# Patient Record
Sex: Female | Born: 1985 | Race: Black or African American | Hispanic: No | Marital: Single | Smoking: Never smoker
Health system: Southern US, Community
[De-identification: ages and names within clinical notes are randomized; demographics above are authoritative.]

## PROBLEM LIST (undated history)

## (undated) ENCOUNTER — Inpatient Hospital Stay (HOSPITAL_COMMUNITY): Payer: Self-pay

## (undated) DIAGNOSIS — O009 Unspecified ectopic pregnancy without intrauterine pregnancy: Secondary | ICD-10-CM

## (undated) HISTORY — PX: DILATION AND CURETTAGE OF UTERUS: SHX78

## (undated) HISTORY — DX: Unspecified ectopic pregnancy without intrauterine pregnancy: O00.90

---
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
## 2006-01-14 ENCOUNTER — Encounter (INDEPENDENT_AMBULATORY_CARE_PROVIDER_SITE_OTHER): Payer: Self-pay | Admitting: *Deleted

## 2006-01-14 ENCOUNTER — Inpatient Hospital Stay (HOSPITAL_COMMUNITY): Admission: AD | Admit: 2006-01-14 | Discharge: 2006-01-15 | Payer: Self-pay | Admitting: Gynecology

## 2006-02-24 ENCOUNTER — Other Ambulatory Visit: Admission: RE | Admit: 2006-02-24 | Discharge: 2006-02-24 | Payer: Self-pay | Admitting: Obstetrics and Gynecology

## 2006-06-23 ENCOUNTER — Other Ambulatory Visit: Admission: RE | Admit: 2006-06-23 | Discharge: 2006-06-23 | Payer: Self-pay | Admitting: Obstetrics and Gynecology

## 2006-12-23 ENCOUNTER — Other Ambulatory Visit: Admission: RE | Admit: 2006-12-23 | Discharge: 2006-12-23 | Payer: Self-pay | Admitting: Obstetrics and Gynecology

## 2007-08-23 ENCOUNTER — Other Ambulatory Visit: Admission: RE | Admit: 2007-08-23 | Discharge: 2007-08-23 | Payer: Self-pay | Admitting: Obstetrics and Gynecology

## 2008-06-14 ENCOUNTER — Ambulatory Visit: Payer: Self-pay | Admitting: Obstetrics and Gynecology

## 2008-06-14 ENCOUNTER — Other Ambulatory Visit: Admission: RE | Admit: 2008-06-14 | Discharge: 2008-06-14 | Payer: Self-pay | Admitting: Obstetrics and Gynecology

## 2008-06-14 ENCOUNTER — Encounter: Payer: Self-pay | Admitting: Obstetrics and Gynecology

## 2008-08-15 ENCOUNTER — Ambulatory Visit: Payer: Self-pay | Admitting: Obstetrics and Gynecology

## 2008-08-16 ENCOUNTER — Ambulatory Visit: Payer: Self-pay | Admitting: Obstetrics and Gynecology

## 2008-10-06 ENCOUNTER — Inpatient Hospital Stay (HOSPITAL_COMMUNITY): Admission: AD | Admit: 2008-10-06 | Discharge: 2008-10-06 | Payer: Self-pay | Admitting: Obstetrics and Gynecology

## 2009-02-01 ENCOUNTER — Ambulatory Visit: Payer: Self-pay | Admitting: Obstetrics and Gynecology

## 2010-01-02 ENCOUNTER — Emergency Department (HOSPITAL_COMMUNITY): Admission: EM | Admit: 2010-01-02 | Discharge: 2010-01-02 | Payer: Self-pay | Admitting: Emergency Medicine

## 2010-01-10 ENCOUNTER — Emergency Department (HOSPITAL_COMMUNITY): Admission: EM | Admit: 2010-01-10 | Discharge: 2010-01-10 | Payer: Self-pay | Admitting: Emergency Medicine

## 2010-05-14 ENCOUNTER — Ambulatory Visit: Payer: Self-pay | Admitting: Obstetrics & Gynecology

## 2010-05-14 ENCOUNTER — Inpatient Hospital Stay (HOSPITAL_COMMUNITY): Admission: AD | Admit: 2010-05-14 | Discharge: 2010-05-15 | Payer: Self-pay | Admitting: Obstetrics and Gynecology

## 2010-12-27 LAB — WET PREP, GENITAL
Clue Cells Wet Prep HPF POC: NONE SEEN
Trich, Wet Prep: NONE SEEN

## 2010-12-27 LAB — URINALYSIS, ROUTINE W REFLEX MICROSCOPIC
Bilirubin Urine: NEGATIVE
Glucose, UA: NEGATIVE mg/dL
Hgb urine dipstick: NEGATIVE
Ketones, ur: NEGATIVE mg/dL
Nitrite: NEGATIVE
Specific Gravity, Urine: >1.03 — ABNORMAL HIGH (ref 1.005–1.030)
pH: 5.5 (ref 5.0–8.0)

## 2010-12-27 LAB — CBC
MCH: 29.9 pg (ref 26.0–34.0)
RBC: 4.03 MIL/uL (ref 3.87–5.11)
WBC: 7.8 10*3/uL (ref 4.0–10.5)

## 2010-12-27 LAB — GC/CHLAMYDIA PROBE AMP, GENITAL
Chlamydia, DNA Probe: NEGATIVE
GC Probe Amp, Genital: NEGATIVE

## 2010-12-27 LAB — ABO/RH: ABO/RH(D): B POS

## 2010-12-27 LAB — URINE MICROSCOPIC-ADD ON: RBC / HPF: NONE SEEN RBC/hpf (ref <3)

## 2011-02-28 NOTE — Op Note (Signed)
NAMEDWANDA, TUFANO          ACCOUNT NO.:  [id number]   MEDICAL RECORD NO.:  [id number]          PATIENT TYPE:  INP   LOCATION:  9173                          FACILITY:  [facility]   PHYSICIAN:  Ivor Costa. Farrel Gobble, M.D. DATE OF BIRTH:  1986-04-16   DATE OF PROCEDURE:  01/14/2006  DATE OF DISCHARGE:                                 OPERATIVE REPORT   PREOPERATIVE DIAGNOSIS:  Retained placenta after 19 weeks spontaneous  abortion.   POSTOPERATIVE DIAGNOSIS:  Retained placenta after 19 weeks spontaneous  abortion.  Postpartum hemorrhage secondary to uterine atony.   OPERATION PERFORMED:  Suction and sharp curettage.   SURGEON:  Ivor Costa. Farrel Gobble, M.D.   ANESTHESIA:  General.   ESTIMATED BLOOD LOSS:  Approximately 1000 mL.   FINDINGS:  The cervix is grossly dilated 2 to 3 cm.  Once obtained, the  placenta was for the most part intact with a fragmented side.  The membranes  were glistening and clear.  The uterus had normal cry and smooth contours.   COMPLICATIONS:  None.   PATHOLOGY:  Placenta to pathology.  Aerobic and anaerobic cultures and a  portion of the placenta for chromosome analysis.   DESCRIPTION OF PROCEDURE:  The patient was taken to the operating room after  it turned out to be over three hours post delivery of a spontaneous abortion  with no change to her cervix.  She was given general anesthesia and placed  in the dorsal lithotomy position, prepped and draped in the usual sterile  fashion.  A sterile weighted speculum was placed in the vagina. The cervix  was visualized and stabilized with a single toothed tenaculum.  It was  grossly dilated and the Kelly clamp placed at the time of delivery was  hanging from the thin umbilical cord.  A curette was then advanced to the  cervix.  However, no placental tissue was able to be obtained and bimanual  exam could not palpate deep enough into the uterus to identify the actual  placement of the placenta.  A suction curette was  then advanced through the  cervix.  Unfortunately, the cervix was dilated beyond the largest suction  curette that was available.  The suction curette was used to clear the  uterus of blood and help disrupt the placenta.  While waiting for a larger  suction curette, a silver clamp was then advanced through the cervix and the  placental edge was able to be grasped. The placenta was removed for the most  part what appeared to be entirely. The suction curette was then advanced  back to the cavity. A small amount of what appeared to be decidua was  obtained; however, the patient continued to bleed.  A gentle curettage  showed a uterine cry on all four walls.  The cervix was clamped around the  suction curette to obtain a better suction; however, the patient still  continued to bleed from the uterine cavity.  Once the placenta was felt to  be removed entirely, the patient did receive methergine IM and uterine  massage. Despite uterine massage, the patient did not respond and her uterus  continued to fill with blood and remain atonic.  The patient then received  Carboprost intramuscularly while we were waiting for Cytotec to come from  the pharmacy.  Once that arrived, the patient received 800 of Cytotec per  rectum.  We continued vigorous uterine massage; however, between massages,  she continued to become atonic and again suction was advanced to help remove  some of the blood.  We never saw anything that looked like tissue once the  methergine had been given the first time.  Uterine massage was continued and  ultimately after 20 minutes, the second Methergine was able to be given.  We  continued to massage vigorously at which point, the uterus did clamp down.  We watched her perineum and vagina for several minutes and the vagina failed  to fill with blood.  The patient was never placed in Trendelenburg position  to falsify any blood accumulation in the uterus. The patient was then  extubated in  the operating room and transferred back to the recovery room  and then labor and delivery for continued observation.  The patient  tolerated the procedure well and her vitals remained stable throughout.      Ivor Costa. Farrel Gobble, M.D.  Electronically Signed     THL/MEDQ  D:  01/14/2006  T:  01/15/2006  Job:  [id number]

## 2011-02-28 NOTE — Discharge Summary (Signed)
Michelle Hobbs, Michelle Hobbs          ACCOUNT NO.:  [id number]   MEDICAL RECORD NO.:  [id number]          PATIENT TYPE:  INP   LOCATION:  9317                          [facility]ILITY:  [facility]   PHYSICIAN:  Ivor Costa. Farrel Gobble, M.D. DATE OF BIRTH:  [birth year]   DATE OF ADMISSION:  01/14/2006  DATE OF DISCHARGE:  01/15/2006                                 DISCHARGE SUMMARY   PRINCIPAL DIAGNOSIS:  Spontaneous AB at 19 weeks.   ADDITIONAL DIAGNOSES:  Retained placenta and postpartum hemorrhage.   PRINCIPAL PROCEDURE:  Delivery of 19-week nonviable infant.   ADDITIONAL PROCEDURES:  D&C and transfusion.   HOSPITAL COURSE:  The patient presented early in the morning of January 14, 2006, with complaints of a prolapsed fetal leg through the vagina.  She was  brought in by EMS.  The infant was noted to be delivered to the mid torso at  that point.  The patient was given IV sedation in order to aid in delivery  of the fetal head, as she was rather uncomfortable.  Upon delivery, there  was no fetal heart rate, and the placenta remained intact.  The patient was  given IV Pitocin and despite 2-1/2 hours from delivery did not spontaneously  deliver the placenta.  She had had no bleeding since the initial delivery of  the fetus and had no further cramping.  The patient was therefore  transferred to the OR at what was over 3 hours postdelivery.  She was given  general anesthesia, and still had no delivery of the placenta.  She  underwent a sharp curettage with removal of the placenta which appeared to  be grossly intact and a suction curettage as well.  Despite what felt like a  good uterine cry throughout, the patient's uterus became boggy, and she  continued to bleed briskly.  The patient was ultimately treated with  Methergine x2, Hemabate, and 800 of Cytotec per rectum, after which point  the patient stopped bleeding but because of the amount of bleeding, a CBC  was performed 4 hours postdelivery.  The  estimated blood loss was  approximately 1500.  Her preop hemoglobin was 10.6.  Her postop hemoglobin  was 7.7 and initially, the patient was planning on going home.  Her vitals  were stable.  However, prior to discharge, the patient began becoming  tachycardiac.  Of note, her IV site had been locked.  A repeat CBC was drawn  in the morning of April 5, which showed her hemoglobin which was now felt to  be more equilibrated at 5.7.  The patient was symptomatic and was therefore  transfused 2 units of packed red cells per Dr. Lily Peer.  By this evening,  the patient was feeling well.  She was not short of breath.  She was  ambulating without difficulty, and she was no longer tachycardic.  Her  hemoglobin post 4 hours posttransfusion was 8; her hematocrit was 23.9 with  platelets of 186.  Her white count was 6.1.  Her vitals at discharge:  Her  blood pressure was 112/59.  Her heart rate was 90.  Her respiratory rate was  16.  She remained afebrile throughout her hospital course.  The patient was  discharged home in stable condition.  She will follow up in the office 2  weeks postop.  She will continue to take her prenatal vitamins with extra  iron, and we will review her labs at that point.  No pain medication was  given.  All questions were addressed.      Ivor Costa. Farrel Gobble, M.D.  Electronically Signed    THL/MEDQ  D:  01/15/2006  T:  01/16/2006  Job:  [id number]

## 2011-07-17 LAB — WET PREP, GENITAL
Trich, Wet Prep: NONE SEEN
Yeast Wet Prep HPF POC: NONE SEEN

## 2011-07-17 LAB — URINALYSIS, ROUTINE W REFLEX MICROSCOPIC
Bilirubin Urine: NEGATIVE
Protein, ur: NEGATIVE mg/dL
Specific Gravity, Urine: >1.03 — ABNORMAL HIGH (ref 1.005–1.030)
pH: 5.5 (ref 5.0–8.0)

## 2011-07-17 LAB — CBC
Hemoglobin: 11.3 g/dL — ABNORMAL LOW (ref 12.0–15.0)
MCHC: 33.3 g/dL (ref 30.0–36.0)
MCV: 88.8 fL (ref 78.0–100.0)

## 2011-12-02 ENCOUNTER — Encounter: Payer: Self-pay | Admitting: Gynecology

## 2011-12-02 ENCOUNTER — Ambulatory Visit (INDEPENDENT_AMBULATORY_CARE_PROVIDER_SITE_OTHER): Payer: Commercial Managed Care - PPO | Admitting: Gynecology

## 2011-12-02 ENCOUNTER — Other Ambulatory Visit (HOSPITAL_COMMUNITY)
Admission: RE | Admit: 2011-12-02 | Discharge: 2011-12-02 | Disposition: A | Discharge status: Home / Self Care | Payer: Commercial Managed Care - PPO | Source: Ambulatory Visit | Attending: Gynecology | Admitting: Gynecology

## 2011-12-02 DIAGNOSIS — N760 Acute vaginitis: Secondary | ICD-10-CM

## 2011-12-02 DIAGNOSIS — Z01419 Encounter for gynecological examination (general) (routine) without abnormal findings: Secondary | ICD-10-CM

## 2011-12-02 DIAGNOSIS — A499 Bacterial infection, unspecified: Secondary | ICD-10-CM

## 2011-12-02 DIAGNOSIS — IMO0001 Reserved for inherently not codable concepts without codable children: Secondary | ICD-10-CM

## 2011-12-02 DIAGNOSIS — Z113 Encounter for screening for infections with a predominantly sexual mode of transmission: Secondary | ICD-10-CM

## 2011-12-02 DIAGNOSIS — Z309 Encounter for contraceptive management, unspecified: Secondary | ICD-10-CM

## 2011-12-02 DIAGNOSIS — B9689 Other specified bacterial agents as the cause of diseases classified elsewhere: Secondary | ICD-10-CM

## 2011-12-02 DIAGNOSIS — R635 Abnormal weight gain: Secondary | ICD-10-CM

## 2011-12-02 LAB — WET PREP FOR TRICH, YEAST, CLUE
Trich, Wet Prep: NONE SEEN
WBC, Wet Prep HPF POC: NONE SEEN

## 2011-12-02 LAB — URINALYSIS W MICROSCOPIC + REFLEX CULTURE
Bilirubin Urine: NEGATIVE
Glucose, UA: NEGATIVE mg/dL
Ketones, ur: NEGATIVE mg/dL
Specific Gravity, Urine: 1.02 (ref 1.005–1.030)
Urobilinogen, UA: 0.2 mg/dL (ref 0.0–1.0)

## 2011-12-02 MED ORDER — METRONIDAZOLE 500 MG PO TABS: 500.0000 mg | ORAL_TABLET | Freq: Two times a day (BID) | ORAL | Status: DC

## 2011-12-02 NOTE — Progress Notes (Signed)
Michelle Hobbs 03-29-1986 [id number]   History:    26 y.o. gravida 4 para 0 AB 4 (4 elective AB). Patient received only one had a 3 Gardasil vaccination. Patient refused the remaining vaccinations. She's been using condoms for contraception. Cycles are reported to be normal otherwise. Patient requesting an STD screen. Patient with family history of diabetes. Patient complaining of weight gain as well.  Past medical history,surgical history, family history and social history were all reviewed and documented in the EPIC chart.  Gynecologic History Patient's last menstrual period was 11/23/2011. Contraception: condoms Last Pap: 2009. Results were: normal Last mammogram: Not indicated. Results were: Not indicated  Obstetric History OB History    Grav Para Term Preterm Abortions TAB SAB Ect Mult Living   4 0   4     0     # Outc Date GA Lbr Len/2nd Wgt Sex Del Anes PTL Lv   1 ABT            2 ABT            3 ABT            4 ABT                ROS:  Was performed and pertinent positives and negatives are included in the history.  Exam: chaperone present  BP 128/82  Ht 5' 6.5" (1.689 m)  Wt 241 lb (109.317 kg)  BMI 38.32 kg/m2  LMP 11/23/2011  Body mass index is 38.32 kg/(m^2).  General appearance : Well developed well nourished female. No acute distress HEENT: Neck supple, trachea midline, no carotid bruits, no thyroidmegaly Lungs: Clear to auscultation, no rhonchi or wheezes, or rib retractions  Heart: Regular rate and rhythm, no murmurs or gallops Breast:Examined in sitting and supine position were symmetrical in appearance, no palpable masses or tenderness,  no skin retraction, no nipple inversion, no nipple discharge, no skin discoloration, no axillary or supraclavicular lymphadenopathy Abdomen: no palpable masses or tenderness, no rebound or guarding Extremities: no edema or skin discoloration or tenderness  Pelvic:  Bartholin, Urethra, Skene Glands: Within normal  limits             Vagina: No gross lesions or discharge  Cervix: No gross lesions or discharge  Uterus  anteverted, normal size, shape and consistency, non-tender and mobile  Adnexa  Without masses or tenderness  Anus and perineum  normal   Rectovaginal  normal sphincter tone without palpated masses or tenderness             Hemoccult not done     Assessment/Plan:  26 y.o. gravida 4 para 0 AB 4 (elective terminations). We discussed alternative form of contraception. She states that hormone-containing products cause her to gain weight and she cannot tolerate well. We discussed the nonhormonal IUD such as the ParaGard T380A. Literature information was provided and she will schedule accordingly. We did do a wet prep GC and Chlamydia culture. The wet prep demonstrated evidence of bacterial vaginosis for which she will be treated with Flagyl 500 mg one by mouth twice a day for 5 days. The results of the GC and Chlamydia culture pending at time of this dictation. She is to stop by the lab to check a CBC, hemoglobin A1c, TSH, total cholesterol, RPR, HIV, and hepatitis B and C. Will notify her when the results become available if there is any abnormality. Pap smear done today. She was encouraged to do monthly self breast examination. Literature  information was provided on breast examination as well as on diet and exercise.    Ok Edwards MD, 11:30 AM 12/02/2011

## 2011-12-02 NOTE — Patient Instructions (Addendum)
Exercise to Lose Weight Exercise and a healthy diet may help you lose weight. Your doctor may suggest specific exercises. EXERCISE IDEAS AND TIPS  Choose low-cost things you enjoy doing, such as walking, bicycling, or exercising to workout videos.   Take stairs instead of the elevator.   Walk during your lunch break.   Park your car further away from work or school.   Go to a gym or an exercise class.   Start with 5 to 10 minutes of exercise each day. Build up to 30 minutes of exercise 4 to 6 days a week.   Wear shoes with good support and comfortable clothes.   Stretch before and after working out.   Work out until you breathe harder and your heart beats faster.   Drink extra water when you exercise.   Do not do so much that you hurt yourself, feel dizzy, or get very short of breath.  Exercises that burn about 150 calories:  Running 1  miles in 15 minutes.   Playing volleyball for 45 to 60 minutes.   Washing and waxing a car for 45 to 60 minutes.   Playing touch football for 45 minutes.   Walking 1  miles in 35 minutes.   Pushing a stroller 1  miles in 30 minutes.   Playing basketball for 30 minutes.   Raking leaves for 30 minutes.   Bicycling 5 miles in 30 minutes.   Walking 2 miles in 30 minutes.   Dancing for 30 minutes.   Shoveling snow for 15 minutes.   Swimming laps for 20 minutes.   Walking up stairs for 15 minutes.   Bicycling 4 miles in 15 minutes.   Gardening for 30 to 45 minutes.   Jumping rope for 15 minutes.   Washing windows or floors for 45 to 60 minutes.  Document Released: 11/01/2010 Document Revised: 06/11/2011 Document Reviewed: 11/01/2010 [facility] Patient Information 2012 [place], [place].   Breast Self-Exam A self breast exam may help you find changes or problems while they are still small. Do a breast self-exam:  Every month.   One week after your period (menstrual period).   On the first day of each month if you do not  have periods anymore.  Look for any:  Change in breast color, size, or shape.   Dimples in your breast.   Changes in your nipples or skin.   Dry skin on your breasts or nipples.   Watery or bloody discharge from your nipples.   Feel for:  Lumps.   Thick, hard places.   Any other changes.  HOME CARE There are 3 ways to do the breast self-exam: In front of a mirror.  Lift your arms over your head and turn side to side.   Put your hands on your hips and lean down, then turn from side to side.   Bend forward and turn from side to side.  In the shower.  With soapy hands, check both breasts. Then check above and below your collarbone and your armpits.   Feel above and below your collarbone down to under your breast, and from the center of your chest to the outer edge of the armpit. Check for any lumps or hard spots.   Using the tips of your middle three fingers check your whole breast by pressing your hand over your breast in a circle or in an up and down motion.  Lying down.  Lie flat on your bed.   Put a small  pillow under the breast you are going to check. On that same side, put your hand behind your head.   With your other hand, use the 3 middle fingers to feel the breast.   Move your fingers in a circle around the breast. Press firmly over all parts of the breast to feel for any lumps.  GET HELP RIGHT AWAY IF: You find any changes in your breasts so they can be checked. Document Released: 03/17/2008 Document Revised: 06/11/2011 Document Reviewed: 01/17/2009 [facility] Patient Information 2012 [place], [place].  Bacterial Vaginosis Bacterial vaginosis (BV) is a vaginal infection where the normal balance of bacteria in the vagina is disrupted. The normal balance is then replaced by an overgrowth of certain bacteria. There are several different kinds of bacteria that can cause BV. BV is the most common vaginal infection in women of childbearing age. CAUSES   The cause of  BV is not fully understood. BV develops when there is an increase or imbalance of harmful bacteria.   Some activities or behaviors can upset the normal balance of bacteria in the vagina and put women at increased risk including:   Having a new sex partner or multiple sex partners.   Douching.   Using an intrauterine device (IUD) for contraception.   It is not clear what role sexual activity plays in the development of BV. However, women that have never had sexual intercourse are rarely infected with BV.  Women do not get BV from toilet seats, bedding, swimming pools or from touching objects around them.  SYMPTOMS   Grey vaginal discharge.   A fish-like odor with discharge, especially after sexual intercourse.   Itching or burning of the vagina and vulva.   Burning or pain with urination.   Some women have no signs or symptoms at all.  DIAGNOSIS  Your caregiver must examine the vagina for signs of BV. Your caregiver will perform lab tests and look at the sample of vaginal fluid through a microscope. They will look for bacteria and abnormal cells (clue cells), a pH test higher than 4.5, and a positive amine test all associated with BV.  RISKS AND COMPLICATIONS   Pelvic inflammatory disease (PID).   Infections following gynecology surgery.   Developing HIV.   Developing herpes virus.  TREATMENT  Sometimes BV will clear up without treatment. However, all women with symptoms of BV should be treated to avoid complications, especially if gynecology surgery is planned. Female partners generally do not need to be treated. However, BV may spread between female sex partners so treatment is helpful in preventing a recurrence of BV.   BV may be treated with antibiotics. The antibiotics come in either pill or vaginal cream forms. Either can be used with nonpregnant or pregnant women, but the recommended dosages differ. These antibiotics are not harmful to the baby.   BV can recur after  treatment. If this happens, a second round of antibiotics will often be prescribed.   Treatment is important for pregnant women. If not treated, BV can cause a premature delivery, especially for a pregnant woman who had a premature birth in the past. All pregnant women who have symptoms of BV should be checked and treated.   For chronic reoccurrence of BV, treatment with a type of prescribed gel vaginally twice a week is helpful.  HOME CARE INSTRUCTIONS   Finish all medication as directed by your caregiver.   Do not have sex until treatment is completed.   Tell your sexual partner that  you have a vaginal infection. They should see their caregiver and be treated if they have problems, such as a mild rash or itching.   Practice safe sex. Use condoms. Only have 1 sex partner.  PREVENTION  Basic prevention steps can help reduce the risk of upsetting the natural balance of bacteria in the vagina and developing BV:  Do not have sexual intercourse (be abstinent).   Do not douche.   Use all of the medicine prescribed for treatment of BV, even if the signs and symptoms go away.   Tell your sex partner if you have BV. That way, they can be treated, if needed, to prevent reoccurrence.  SEEK MEDICAL CARE IF:   Your symptoms are not improving after 3 days of treatment.   You have increased discharge, pain, or fever.  MAKE SURE YOU:   Understand these instructions.   Will watch your condition.   Will get help right away if you are not doing well or get worse.  FOR MORE INFORMATION  Division of STD Prevention (DSTDP), Centers for Disease Control and Prevention: [employer] American Social Health Association (ASHA): www.ashastd.org  Document Released: 09/29/2005 Document Revised: 06/11/2011 Document Reviewed: 03/22/2009 [facility] Patient Information 2012 [place], [place].

## 2011-12-03 LAB — CBC WITH DIFFERENTIAL/PLATELET
Basophils Absolute: 0 10*3/uL (ref 0.0–0.1)
Eosinophils Relative: 3 % (ref 0–5)
HCT: 42.6 % (ref 36.0–46.0)
Lymphocytes Relative: 29 % (ref 12–46)
MCV: 86.1 fL (ref 78.0–100.0)
Neutro Abs: 3.3 10*3/uL (ref 1.7–7.7)
Platelets: 225 10*3/uL (ref 150–400)
RDW: 12.9 % (ref 11.5–15.5)

## 2011-12-03 LAB — HEPATITIS C ANTIBODY: HCV Ab: NEGATIVE

## 2011-12-03 LAB — HEMOGLOBIN A1C: Hgb A1c MFr Bld: 5.1 % (ref <5.7)

## 2011-12-03 LAB — HEPATITIS B SURFACE ANTIGEN: Hepatitis B Surface Ag: NEGATIVE

## 2011-12-03 LAB — GC/CHLAMYDIA PROBE AMP, GENITAL
Chlamydia, DNA Probe: NEGATIVE
GC Probe Amp, Genital: NEGATIVE

## 2011-12-03 LAB — HIV ANTIBODY (ROUTINE TESTING W REFLEX): HIV: NONREACTIVE

## 2011-12-03 LAB — RPR

## 2011-12-04 ENCOUNTER — Telehealth: Payer: Self-pay | Admitting: *Deleted

## 2011-12-04 DIAGNOSIS — B9689 Other specified bacterial agents as the cause of diseases classified elsewhere: Secondary | ICD-10-CM

## 2011-12-04 DIAGNOSIS — N760 Acute vaginitis: Secondary | ICD-10-CM

## 2011-12-04 MED ORDER — METRONIDAZOLE 500 MG PO TABS: 500.0000 mg | ORAL_TABLET | Freq: Two times a day (BID) | ORAL | Status: AC

## 2011-12-04 NOTE — Telephone Encounter (Signed)
Pt called stating she left her rx at her mother house in [place]. Pt requested to have flagyl resent to pharmacy, rx sent to pharmacy. I explained to pt that her insurance may not help pay for medication due to 2 request.

## 2011-12-05 ENCOUNTER — Other Ambulatory Visit: Payer: Self-pay | Admitting: *Deleted

## 2011-12-05 DIAGNOSIS — R319 Hematuria, unspecified: Secondary | ICD-10-CM

## 2011-12-09 ENCOUNTER — Other Ambulatory Visit: Payer: Self-pay | Admitting: Gynecology

## 2011-12-09 ENCOUNTER — Other Ambulatory Visit: Payer: Commercial Managed Care - PPO

## 2011-12-09 ENCOUNTER — Telehealth: Payer: Self-pay | Admitting: *Deleted

## 2011-12-09 MED ORDER — FLUCONAZOLE 150 MG PO TABS: 150.0000 mg | ORAL_TABLET | Freq: Once | ORAL | Status: AC

## 2011-12-09 NOTE — Telephone Encounter (Signed)
Pt was seen on 12/02/11 for aex and giving flagyl 500 mg. Pt now has yeast infection, itching ,white discharge. Pt would like medication to help relieve. Please advise

## 2011-12-09 NOTE — Telephone Encounter (Signed)
Order for prescription for Diflucan placed. Please call patient to go the pharmacy to pick up.

## 2011-12-09 NOTE — Telephone Encounter (Signed)
Left message on pt vm that rx sent to pharmacy.

## 2011-12-12 DIAGNOSIS — O009 Unspecified ectopic pregnancy without intrauterine pregnancy: Secondary | ICD-10-CM

## 2011-12-12 HISTORY — PX: PELVIC LAPAROSCOPY: SHX162

## 2011-12-12 HISTORY — DX: Unspecified ectopic pregnancy without intrauterine pregnancy: O00.90

## 2012-06-16 ENCOUNTER — Ambulatory Visit (INDEPENDENT_AMBULATORY_CARE_PROVIDER_SITE_OTHER): Payer: Commercial Managed Care - PPO

## 2012-06-16 ENCOUNTER — Ambulatory Visit (INDEPENDENT_AMBULATORY_CARE_PROVIDER_SITE_OTHER): Payer: Commercial Managed Care - PPO | Admitting: Gynecology

## 2012-06-16 ENCOUNTER — Encounter: Payer: Self-pay | Admitting: Gynecology

## 2012-06-16 VITALS — BP 124/80

## 2012-06-16 DIAGNOSIS — Z8742 Personal history of other diseases of the female genital tract: Secondary | ICD-10-CM

## 2012-06-16 DIAGNOSIS — N912 Amenorrhea, unspecified: Secondary | ICD-10-CM

## 2012-06-16 DIAGNOSIS — N83209 Unspecified ovarian cyst, unspecified side: Secondary | ICD-10-CM

## 2012-06-16 DIAGNOSIS — O2690 Pregnancy related conditions, unspecified, unspecified trimester: Secondary | ICD-10-CM

## 2012-06-16 DIAGNOSIS — N831 Corpus luteum cyst of ovary, unspecified side: Secondary | ICD-10-CM

## 2012-06-16 DIAGNOSIS — Z8759 Personal history of other complications of pregnancy, childbirth and the puerperium: Secondary | ICD-10-CM

## 2012-06-16 LAB — CBC WITH DIFFERENTIAL/PLATELET
Basophils Relative: 0 % (ref 0–1)
Eosinophils Absolute: 0.1 10*3/uL (ref 0.0–0.7)
Eosinophils Relative: 1 % (ref 0–5)
Hemoglobin: 12.1 g/dL (ref 12.0–15.0)
MCH: 28 pg (ref 26.0–34.0)
MCHC: 32.8 g/dL (ref 30.0–36.0)
MCV: 85.4 fL (ref 78.0–100.0)
Monocytes Absolute: 0.4 10*3/uL (ref 0.1–1.0)
Monocytes Relative: 7 % (ref 3–12)
Neutrophils Relative %: 67 % (ref 43–77)

## 2012-06-16 LAB — US OB TRANSVAGINAL

## 2012-06-16 LAB — HCG, QUANTITATIVE, PREGNANCY: hCG, Beta Chain, Quant, S: 112208 m[IU]/mL

## 2012-06-16 NOTE — Progress Notes (Signed)
Patient is a 26 year old now gravida 5 para 0 (1 ectopic pregnancy in March whereby she states her her left fallopian tube was removed at  [facility] in [facility] She not been using any form of contraception recently. She had a positive home pregnancy test. She denied any vaginal bleeding or abdominal pain. She has had 2 prior elective abortions and one spontaneous AB. She denied any nausea or vomiting. She denies any prior history of STDs. Urine pregnancy test confirmed positive the office today.  Exam: Abdomen soft nontender no rebound or guarding pelvic: Bartholin urethra Skene was within normal limits Vagina: No lesions or discharge Cervix: No lesions or discharge Uterus 6-8 weeks size patient slightly overweight adnexa difficult to assess but there was no rebound or guarding during bimanual examination. Rectal exam not done  CBC today: Hemoglobin 12.1 hematocrit 36.9 platelet count 213,000  Quantitative beta-hCG today: 112,208 milli-international units per mL  Transvaginal ultrasound today: Viable intrauterine pregnancy size greater than dates was noted by last menstrual period by 7 days. Fetal pole and cardiac activity was seen. Cervix long closed. Normal yolk sac. Right ovary normal. Left ovary echo-free cyst measuring 29 x 23 mm and a corpus luteum cyst 27 x 24 mm was noted when negative fluid in the cul-de-sac.  Assessment/plan: Viable intrauterine pregnancy size greater than dates by one week. Estimated gestational age [redacted] weeks by last menstrual period with due date 02/02/2013. By ultrasound she is 8 weeks with an estimated date of confinement 01/26/2013. Patient was complaining of nausea and occasional vomiting she'll be given a prescription of Reglan 10 mg to take one by mouth every 4-6 hours when necessary. She was encouraged to have multiple small feedings throughout the day. She will be prescribed prenatal vitamins as well. She'll be referred to our [facility]  colleagues for her obstetrical care. Were provided patient with a copy of the ultrasound for her to carry with her to her first OB visit in 2 weeks.
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
## 2012-08-10 LAB — OB RESULTS CONSOLE GBS: GBS: POSITIVE

## 2012-08-25 ENCOUNTER — Other Ambulatory Visit: Payer: Self-pay | Admitting: Gynecology

## 2012-10-13 NOTE — L&D Delivery Note (Signed)
Delivery Note At 5:15 PM a viable and healthy female was delivered via Vaginal, Spontaneous Delivery (Presentation: Right Occiput Anterior).  APGAR: 7, 9; weight .   Placenta status: Intact, Spontaneous. Short not sent  Cord: 3 vessels with the following complications: Short.  Cord pH: none  Anesthesia: Epidural  Episiotomy: None Lacerations: Labial;Vaginal;Sulcus Suture Repair: 3.0 chromic Est. Blood Loss (mL): 250  Mom to postpartum.  Baby to nursery-stable.  Michelle Hobbs A 01/28/2013, 5:50 PM

## 2012-10-20 LAB — OB RESULTS CONSOLE HIV ANTIBODY (ROUTINE TESTING): HIV: NONREACTIVE

## 2012-10-20 LAB — OB RESULTS CONSOLE ABO/RH

## 2012-10-30 LAB — OB RESULTS CONSOLE ABO/RH: RH Type: POSITIVE

## 2013-01-16 ENCOUNTER — Inpatient Hospital Stay (HOSPITAL_COMMUNITY)
Admission: AD | Admit: 2013-01-16 | Discharge: 2013-01-16 | Disposition: A | Discharge status: Home / Self Care | Payer: Commercial Managed Care - PPO | Source: Ambulatory Visit | Attending: Obstetrics and Gynecology | Admitting: Obstetrics and Gynecology

## 2013-01-16 ENCOUNTER — Other Ambulatory Visit: Payer: Self-pay

## 2013-01-16 ENCOUNTER — Encounter (HOSPITAL_COMMUNITY): Payer: Self-pay | Admitting: *Deleted

## 2013-01-16 DIAGNOSIS — R079 Chest pain, unspecified: Secondary | ICD-10-CM | POA: Diagnosis present

## 2013-01-16 DIAGNOSIS — R1013 Epigastric pain: Secondary | ICD-10-CM | POA: Insufficient documentation

## 2013-01-16 DIAGNOSIS — O99891 Other specified diseases and conditions complicating pregnancy: Secondary | ICD-10-CM | POA: Diagnosis not present

## 2013-01-16 MED ORDER — PANTOPRAZOLE SODIUM 40 MG IV SOLR: 40.0000 mg | Freq: Once | INTRAVENOUS | Status: DC

## 2013-01-16 MED ORDER — PANTOPRAZOLE SODIUM 40 MG PO TBEC
40.0000 mg | DELAYED_RELEASE_TABLET | Freq: Once | ORAL | Status: AC
  Administered 2013-01-16: 40 mg via ORAL
  Filled 2013-01-16: qty 1

## 2013-01-16 MED ORDER — GI COCKTAIL ~~LOC~~
30.0000 mL | Freq: Once | ORAL | Status: AC
  Administered 2013-01-16: 30 mL via ORAL
  Filled 2013-01-16: qty 30

## 2013-01-16 NOTE — MAU Note (Signed)
Pt G5 P0 at 38.4wks having chest pain for 6 weeks, the pain comes and goes, lasting about 3 hrs at a time.
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
## 2013-01-20 ENCOUNTER — Other Ambulatory Visit: Payer: Self-pay | Admitting: Gastroenterology

## 2013-01-20 DIAGNOSIS — R1013 Epigastric pain: Secondary | ICD-10-CM

## 2013-01-20 DIAGNOSIS — R0789 Other chest pain: Secondary | ICD-10-CM

## 2013-01-27 ENCOUNTER — Other Ambulatory Visit: Payer: Self-pay | Admitting: Obstetrics and Gynecology

## 2013-01-28 ENCOUNTER — Encounter (HOSPITAL_COMMUNITY): Payer: Self-pay

## 2013-01-28 ENCOUNTER — Inpatient Hospital Stay (HOSPITAL_COMMUNITY): Payer: Commercial Managed Care - PPO | Admitting: Anesthesiology

## 2013-01-28 ENCOUNTER — Other Ambulatory Visit: Payer: Self-pay | Admitting: Obstetrics and Gynecology

## 2013-01-28 ENCOUNTER — Inpatient Hospital Stay (HOSPITAL_COMMUNITY)
Admission: RE | Admit: 2013-01-28 | Discharge: 2013-01-30 | DRG: 775 | Disposition: A | Discharge status: Home / Self Care | Payer: Commercial Managed Care - PPO | Source: Ambulatory Visit | Attending: Obstetrics and Gynecology | Admitting: Obstetrics and Gynecology

## 2013-01-28 ENCOUNTER — Encounter (HOSPITAL_COMMUNITY): Payer: Self-pay | Admitting: Anesthesiology

## 2013-01-28 VITALS — BP 120/76 | HR 73 | Temp 98.5°F | Resp 18 | Ht 67.0 in | Wt 247.0 lb

## 2013-01-28 DIAGNOSIS — O480 Post-term pregnancy: Secondary | ICD-10-CM | POA: Diagnosis present

## 2013-01-28 DIAGNOSIS — O9903 Anemia complicating the puerperium: Secondary | ICD-10-CM | POA: Diagnosis not present

## 2013-01-28 DIAGNOSIS — O99892 Other specified diseases and conditions complicating childbirth: Secondary | ICD-10-CM | POA: Diagnosis present

## 2013-01-28 DIAGNOSIS — Z22330 Carrier of Group B streptococcus: Secondary | ICD-10-CM

## 2013-01-28 DIAGNOSIS — D62 Acute posthemorrhagic anemia: Secondary | ICD-10-CM | POA: Diagnosis not present

## 2013-01-28 LAB — CBC
Hemoglobin: 11.9 g/dL — ABNORMAL LOW (ref 12.0–15.0)
MCHC: 33.7 g/dL (ref 30.0–36.0)
RBC: 4.04 MIL/uL (ref 3.87–5.11)
WBC: 8.9 10*3/uL (ref 4.0–10.5)

## 2013-01-28 LAB — TYPE AND SCREEN: ABO/RH(D): B POS

## 2013-01-28 LAB — RPR: RPR Ser Ql: NONREACTIVE

## 2013-01-28 MED ORDER — PANTOPRAZOLE SODIUM 40 MG PO TBEC
40.0000 mg | DELAYED_RELEASE_TABLET | Freq: Every day | ORAL | Status: DC
  Administered 2013-01-30: 40 mg via ORAL
  Filled 2013-01-28 (×3): qty 1

## 2013-01-28 MED ORDER — OXYTOCIN 40 UNITS IN LACTATED RINGERS INFUSION - SIMPLE MED
1.0000 m[IU]/min | INTRAVENOUS | Status: DC
  Administered 2013-01-28: 2 m[IU]/min via INTRAVENOUS

## 2013-01-28 MED ORDER — CITRIC ACID-SODIUM CITRATE 334-500 MG/5ML PO SOLN: 30.0000 mL | ORAL | Status: DC | PRN

## 2013-01-28 MED ORDER — SIMETHICONE 80 MG PO CHEW: 80.0000 mg | CHEWABLE_TABLET | ORAL | Status: DC | PRN

## 2013-01-28 MED ORDER — ONDANSETRON HCL 4 MG PO TABS: 4.0000 mg | ORAL_TABLET | ORAL | Status: DC | PRN

## 2013-01-28 MED ORDER — NALBUPHINE SYRINGE 5 MG/0.5 ML: 10.0000 mg | INJECTION | INTRAMUSCULAR | Status: DC | PRN

## 2013-01-28 MED ORDER — ACETAMINOPHEN 325 MG PO TABS: 650.0000 mg | ORAL_TABLET | ORAL | Status: DC | PRN

## 2013-01-28 MED ORDER — LIDOCAINE HCL (PF) 1 % IJ SOLN
30.0000 mL | INTRAMUSCULAR | Status: DC | PRN
Start: 2013-01-28 — End: 2013-01-30
  Administered 2013-01-28: 30 mL via SUBCUTANEOUS
  Filled 2013-01-28 (×2): qty 30

## 2013-01-28 MED ORDER — PENICILLIN G POTASSIUM 5000000 UNITS IJ SOLR
2.5000 10*6.[IU] | INTRAVENOUS | Status: DC
  Administered 2013-01-28: 2.5 10*6.[IU] via INTRAVENOUS
  Filled 2013-01-28 (×5): qty 2.5

## 2013-01-28 MED ORDER — OXYTOCIN 10 UNIT/ML IJ SOLN: 10.0000 [IU] | Freq: Once | INTRAMUSCULAR | Status: DC

## 2013-01-28 MED ORDER — DIPHENHYDRAMINE HCL 25 MG PO CAPS: 25.0000 mg | ORAL_CAPSULE | Freq: Four times a day (QID) | ORAL | Status: DC | PRN

## 2013-01-28 MED ORDER — OXYTOCIN 40 UNITS IN LACTATED RINGERS INFUSION - SIMPLE MED
62.5000 mL/h | INTRAVENOUS | Status: DC
  Administered 2013-01-28: 999 mL/h via INTRAVENOUS
  Filled 2013-01-28: qty 1000

## 2013-01-28 MED ORDER — FENTANYL 2.5 MCG/ML BUPIVACAINE 1/10 % EPIDURAL INFUSION (WH - ANES)
14.0000 mL/h | INTRAMUSCULAR | Status: DC | PRN
  Filled 2013-01-28: qty 125

## 2013-01-28 MED ORDER — LANOLIN HYDROUS EX OINT: TOPICAL_OINTMENT | CUTANEOUS | Status: DC | PRN

## 2013-01-28 MED ORDER — FENTANYL 2.5 MCG/ML BUPIVACAINE 1/10 % EPIDURAL INFUSION (WH - ANES)
INTRAMUSCULAR | Status: DC | PRN
  Administered 2013-01-28: 14 mL/h via EPIDURAL

## 2013-01-28 MED ORDER — LIDOCAINE HCL (PF) 1 % IJ SOLN
INTRAMUSCULAR | Status: DC | PRN
  Administered 2013-01-28 (×2): 4 mL

## 2013-01-28 MED ORDER — DIPHENHYDRAMINE HCL 50 MG/ML IJ SOLN: 12.5000 mg | INTRAMUSCULAR | Status: DC | PRN

## 2013-01-28 MED ORDER — OXYCODONE-ACETAMINOPHEN 5-325 MG PO TABS
1.0000 | ORAL_TABLET | ORAL | Status: DC | PRN
  Administered 2013-01-29 (×3): 1 via ORAL
  Filled 2013-01-28 (×4): qty 1

## 2013-01-28 MED ORDER — LACTATED RINGERS IV SOLN: 500.0000 mL | INTRAVENOUS | Status: DC | PRN

## 2013-01-28 MED ORDER — PHENYLEPHRINE 40 MCG/ML (10ML) SYRINGE FOR IV PUSH (FOR BLOOD PRESSURE SUPPORT)
80.0000 ug | PREFILLED_SYRINGE | INTRAVENOUS | Status: DC | PRN
  Filled 2013-01-28: qty 5

## 2013-01-28 MED ORDER — LACTATED RINGERS IV SOLN
INTRAVENOUS | Status: DC
  Administered 2013-01-28: 08:00:00 via INTRAVENOUS

## 2013-01-28 MED ORDER — DIBUCAINE 1 % RE OINT
1.0000 "application " | TOPICAL_OINTMENT | RECTAL | Status: DC | PRN
  Filled 2013-01-28: qty 28

## 2013-01-28 MED ORDER — IBUPROFEN 600 MG PO TABS
600.0000 mg | ORAL_TABLET | Freq: Four times a day (QID) | ORAL | Status: DC
  Administered 2013-01-29 – 2013-01-30 (×7): 600 mg via ORAL
  Filled 2013-01-28 (×7): qty 1

## 2013-01-28 MED ORDER — BENZOCAINE-MENTHOL 20-0.5 % EX AERO
1.0000 "application " | INHALATION_SPRAY | CUTANEOUS | Status: DC | PRN
  Administered 2013-01-28: 1 via TOPICAL
  Filled 2013-01-28 (×2): qty 56

## 2013-01-28 MED ORDER — PRENATAL MULTIVITAMIN CH
1.0000 | ORAL_TABLET | Freq: Every day | ORAL | Status: DC
  Administered 2013-01-29 – 2013-01-30 (×2): 1 via ORAL
  Filled 2013-01-28 (×2): qty 1

## 2013-01-28 MED ORDER — SENNOSIDES-DOCUSATE SODIUM 8.6-50 MG PO TABS
2.0000 | ORAL_TABLET | Freq: Every day | ORAL | Status: DC
  Administered 2013-01-28 – 2013-01-29 (×2): 2 via ORAL

## 2013-01-28 MED ORDER — OXYCODONE-ACETAMINOPHEN 5-325 MG PO TABS: 1.0000 | ORAL_TABLET | ORAL | Status: DC | PRN

## 2013-01-28 MED ORDER — EPHEDRINE 5 MG/ML INJ: 10.0000 mg | INTRAVENOUS | Status: DC | PRN

## 2013-01-28 MED ORDER — PENICILLIN G POTASSIUM 5000000 UNITS IJ SOLR
5.0000 10*6.[IU] | Freq: Once | INTRAVENOUS | Status: AC
  Administered 2013-01-28: 5 10*6.[IU] via INTRAVENOUS
  Filled 2013-01-28 (×2): qty 5

## 2013-01-28 MED ORDER — WITCH HAZEL-GLYCERIN EX PADS: 1.0000 "application " | MEDICATED_PAD | CUTANEOUS | Status: DC | PRN

## 2013-01-28 MED ORDER — ONDANSETRON HCL 4 MG/2ML IJ SOLN: 4.0000 mg | Freq: Four times a day (QID) | INTRAMUSCULAR | Status: DC | PRN

## 2013-01-28 MED ORDER — ONDANSETRON HCL 4 MG/2ML IJ SOLN: 4.0000 mg | INTRAMUSCULAR | Status: DC | PRN

## 2013-01-28 MED ORDER — LACTATED RINGERS IV SOLN: 500.0000 mL | Freq: Once | INTRAVENOUS | Status: DC

## 2013-01-28 MED ORDER — EPHEDRINE 5 MG/ML INJ
10.0000 mg | INTRAVENOUS | Status: DC | PRN
  Filled 2013-01-28: qty 4

## 2013-01-28 MED ORDER — PHENYLEPHRINE 40 MCG/ML (10ML) SYRINGE FOR IV PUSH (FOR BLOOD PRESSURE SUPPORT): 80.0000 ug | PREFILLED_SYRINGE | INTRAVENOUS | Status: DC | PRN

## 2013-01-28 MED ORDER — ZOLPIDEM TARTRATE 5 MG PO TABS: 5.0000 mg | ORAL_TABLET | Freq: Every evening | ORAL | Status: DC | PRN

## 2013-01-28 MED ORDER — IBUPROFEN 600 MG PO TABS: 600.0000 mg | ORAL_TABLET | Freq: Four times a day (QID) | ORAL | Status: DC | PRN

## 2013-01-28 MED ORDER — OXYTOCIN BOLUS FROM INFUSION: 500.0000 mL | INTRAVENOUS | Status: DC

## 2013-01-28 NOTE — Progress Notes (Signed)
Difficult to trace continually FHT's and uterine activity due to frequent movement by patient.

## 2013-01-28 NOTE — Progress Notes (Signed)
S: called 2nd to full dilation Pushing SROM @ 12;30 pm clear fluid  O: complete /+2 station Tracing: baseline 140  Ctx q 2-3 mins  IMP: complete GBS cx (+) on PCN P) cont pushing

## 2013-01-28 NOTE — Progress Notes (Signed)
Michelle Hobbs is a 27 y.o. G5P0040 at [redacted]w[redacted]d by LMP admitted for induction of labor due to Post dates. Due date 01/26/2013.  Subjective: No complaint  Objective: BP 92/64  Pulse 86  Temp(Src) 97.9 F (36.6 C)  Resp 16  Ht 5\' 7"  (1.702 m)  Wt 112.038 kg (247 lb)  BMI 38.68 kg/m2  LMP 04/28/2012    Pitocin 6 miu  FHT:  FHR: 140 bpm, variability: moderate,  accelerations:  Present,  decelerations:  Absent UC:  occ SVE:   Dilation: 2 Effacement (%): 90 Station: -2 Exam by:: Ferne Coe RN  Labs: Lab Results  Component Value Date   WBC 8.9 01/28/2013   HGB 11.9* 01/28/2013   HCT 35.3* 01/28/2013   MCV 87.4 01/28/2013   PLT 190 01/28/2013    Assessment / Plan: Postdate GBS cx (+) on IV PCN P) cont pitocin. Amniotomy prn. Cont IV PCN  Labor: on pitocin Preeclampsia:  no signs or symptoms of toxicity Fetal Wellbeing:  Category I Pain Control:  n/a I/D:  n/a Anticipated MOD:  NSVD  Michelle Hobbs A 01/28/2013, 10:22 AM

## 2013-01-28 NOTE — Anesthesia Preprocedure Evaluation (Signed)
Anesthesia Evaluation  Patient identified by MRN, date of birth, ID band Patient awake    Reviewed: Allergy & Precautions, H&P , Patient's Chart, lab work & pertinent test results  Airway Mallampati: III TM Distance: >3 FB Neck ROM: full    Dental no notable dental hx. (+) Teeth Intact   Pulmonary neg pulmonary ROS,  breath sounds clear to auscultation  Pulmonary exam normal       Cardiovascular negative cardio ROS  Rhythm:regular Rate:Normal     Neuro/Psych negative neurological ROS  negative psych ROS   GI/Hepatic negative GI ROS, Neg liver ROS,   Endo/Other  Morbid obesity  Renal/GU negative Renal ROS  negative genitourinary   Musculoskeletal   Abdominal Normal abdominal exam  (+)   Peds  Hematology negative hematology ROS (+)   Anesthesia Other Findings   Reproductive/Obstetrics (+) Pregnancy                           Anesthesia Physical Anesthesia Plan  ASA: III  Anesthesia Plan: Epidural   Post-op Pain Management:    Induction:   Airway Management Planned:   Additional Equipment:   Intra-op Plan:   Post-operative Plan:   Informed Consent: I have reviewed the patients History and Physical, chart, labs and discussed the procedure including the risks, benefits and alternatives for the proposed anesthesia with the patient or authorized representative who has indicated his/her understanding and acceptance.     Plan Discussed with: Anesthesiologist  Anesthesia Plan Comments:         Anesthesia Quick Evaluation   

## 2013-01-28 NOTE — Anesthesia Procedure Notes (Signed)
Epidural Patient location during procedure: OB Start time: 01/28/2013 12:05 PM  Staffing Anesthesiologist: Girtie Wiersma A. Performed by: anesthesiologist   Preanesthetic Checklist Completed: patient identified, site marked, surgical consent, pre-op evaluation, timeout performed, IV checked, risks and benefits discussed and monitors and equipment checked  Epidural Patient position: sitting Prep: site prepped and draped and DuraPrep Patient monitoring: continuous pulse ox and blood pressure Approach: midline Injection technique: LOR air  Needle:  Needle type: Tuohy  Needle gauge: 17 G Needle length: 9 cm and 9 Needle insertion depth: 7 cm Catheter type: closed end flexible Catheter size: 19 Gauge Catheter at skin depth: 13 cm Test dose: negative and Other  Assessment Events: blood not aspirated, injection not painful, no injection resistance, negative IV test and no paresthesia  Additional Notes Patient identified. Risks and benefits discussed including failed block, incomplete  Pain control, post dural puncture headache, nerve damage, paralysis, blood pressure Changes, nausea, vomiting, reactions to medications-both toxic and allergic and post Partum back pain. All questions were answered. Patient expressed understanding and wished to proceed. Sterile technique was used throughout procedure. Epidural site was Dressed with sterile barrier dressing. No paresthesias, signs of intravascular injection Or signs of intrathecal spread were encountered.  Patient was more comfortable after the epidural was dosed. Please see RN's note for documentation of vital signs and FHR which are stable.

## 2013-01-28 NOTE — H&P (Signed)
Michelle Hobbs is a 27 y.o. female presenting for induction @ 40 2/[redacted] weeks gestation 2nd to post date. PNC notable for weekly 17 OHP due to hx PTB. (+) GBs  History OB History   Grav Para Term Preterm Abortions TAB SAB Ect Mult Living   5 0   4  1 1   0     Past Medical History  Diagnosis Date  . Ectopic pregnancy march 2013   Past Surgical History  Procedure Laterality Date  . Dilation and curettage of uterus      05/2011  . Pelvic laparoscopy  march 2013    left salpingectomy   Family History: family history includes Cancer (age of onset: 61) in her paternal grandfather; Diabetes in her maternal aunt and paternal aunt; and Hypertension in her mother. Social History:  reports that she has never smoked. She has never used smokeless tobacco. She reports that she does not drink alcohol or use illicit drugs.   Prenatal Transfer Tool  Maternal Diabetes: No Genetic Screening: Normal Maternal Ultrasounds/Referrals: Normal Fetal Ultrasounds or other Referrals:  None Maternal Substance Abuse:  No Significant Maternal Medications:  Meds include: Protonix Significant Maternal Lab Results:  Lab values include: Group B Strep positive Other Comments:  None  Review of Systems  All other systems reviewed and are negative.    Dilation: Fingertip Effacement (%): 50 Station: -2 Exam by:: lee Blood pressure 135/75, pulse 85, temperature 97.9 F (36.6 C), resp. rate 18, height 5\' 7"  (1.702 m), weight 112.038 kg (247 lb), last menstrual period 04/28/2012. Exam Physical Exam  Constitutional: She is oriented to person, place, and time. She appears well-developed and well-nourished.  Neck: Normal range of motion.  Cardiovascular: Regular rhythm.   Respiratory: Breath sounds normal.  GI: Soft.  Musculoskeletal: She exhibits no edema.  Neurological: She is alert and oriented to person, place, and time.  Skin: Skin is warm and dry.  Psychiatric: She has a normal mood and affect.     Prenatal labs: ABO, Rh:  B positive Antibody:  neg Rubella:  Immune RPR:   NR HBsAg:   neg HIV:   Neg GBS:   positive Assessment/Plan: Postdate GBS cx positive P) admit routine labs. IV PCN. Pitocin. Analgesic prn  Michelle Hobbs A 01/28/2013, 8:46 AM

## 2013-01-29 LAB — CBC
HCT: 28.7 % — ABNORMAL LOW (ref 36.0–46.0)
Hemoglobin: 9.5 g/dL — ABNORMAL LOW (ref 12.0–15.0)
MCH: 29.1 pg (ref 26.0–34.0)
MCHC: 33.1 g/dL (ref 30.0–36.0)

## 2013-01-29 NOTE — Progress Notes (Signed)
PPD 1 SVD  S:  Reports feeling well - tired             Tolerating po/ No nausea or vomiting             Bleeding is light             Pain controlled with motrin and percocet             Up ad lib / ambulatory / voiding QS  Newborn breast feeding  O:               VS: BP 118/76  Pulse 83  Temp(Src) 97.8 F (36.6 C) (Oral)  Resp 16  Ht 5\' 7"  (1.702 m)  Wt 112.038 kg (247 lb)  BMI 38.68 kg/m2  SpO2 100%  LMP 04/28/2012   LABS:  Recent Labs  01/28/13 0750 01/29/13 0650  WBC 8.9 9.6  HGB 11.9* 9.5*  PLT 190 172                                         I&O: Intake/Output     04/18 0701 - 04/19 0700 04/19 0701 - 04/20 0700   Urine (mL/kg/hr) 650    Blood 250    Total Output 900     Net -900                        Physical Exam:             Alert and oriented X3  Lungs: Clear and unlabored  Heart: regular rate and rhythm / no mumurs  Abdomen: soft, non-tender, non-distended              Fundus: firm, non-tender, U-1  Perineum: mild edema / ice pack in place  Lochia: light  Extremities: trace edema, no calf pain or tenderness    A: PPD # 1              Mild ABL anemia  Doing well - stable status  P:  Routine post partum orders  Anticipate DC in AM  Marlinda Mike CNM, MSN 01/29/2013, 2:52 PM

## 2013-01-29 NOTE — Anesthesia Postprocedure Evaluation (Signed)
Anesthesia Post Note  Patient: Warehouse manager  Procedure(s) Performed: * No procedures listed *  Anesthesia type: Epidural  Patient location: Mother/Baby  Post pain: Pain level controlled  Post assessment: Post-op Vital signs reviewed  Last Vitals:  Filed Vitals:   01/29/13 0626  BP: 98/73  Pulse: 89  Temp: 36.7 C  Resp: 18    Post vital signs: Reviewed  Level of consciousness:alert  Complications: No apparent anesthesia complications

## 2013-01-30 MED ORDER — IBUPROFEN 600 MG PO TABS: 600.0000 mg | ORAL_TABLET | Freq: Four times a day (QID) | ORAL | Status: DC

## 2013-01-30 MED ORDER — OXYCODONE-ACETAMINOPHEN 5-325 MG PO TABS: 1.0000 | ORAL_TABLET | ORAL | Status: DC | PRN

## 2013-01-30 NOTE — Progress Notes (Signed)
PPD 2 SVD  S:  Reports feeling well - ready to go home             Tolerating po/ No nausea or vomiting             Bleeding is light             Pain controlled with motrin and percocet             Up ad lib / ambulatory / voiding QS  Newborn breast feeding  / female   O:               VS: BP 120/76  Pulse 73  Temp(Src) 98.5 F (36.9 C) (Oral)  Resp 18  Ht 5\' 7"  (1.702 m)  Wt 112.038 kg (247 lb)  BMI 38.68 kg/m2  SpO2 100%  LMP 04/28/2012               Physical Exam:             Alert and oriented X3  Abdomen: soft, non-tender, non-distended              Fundus: firm, non-tender, U-1  Perineum: no edema  Lochia: ligth  Extremities: 1+ dependent edema, no calf pain or tenderness    A: PPD # 2              Mild ABL anemia  Doing well - stable status  P:  Routine post partum orders  Discharge to home  Marlinda Mike CNM, MSN 01/30/2013, 8:59 AM

## 2013-01-30 NOTE — Discharge Summary (Signed)
Obstetric Discharge Summary\  Reason for Admission: induction of labor Prenatal Procedures: none Intrapartum Procedures: GBS prophylaxis per protocol / spontaneous vaginal delivery Postpartum Procedures: none Complications-Operative and Postpartum: 2nd degree perineal laceration Hemoglobin  Date Value Range Status  01/29/2013 9.5* 12.0 - 15.0 g/dL Final     REPEATED TO VERIFY     DELTA CHECK NOTED     HCT  Date Value Range Status  01/29/2013 28.7* 36.0 - 46.0 % Final    Physical Exam:  General: alert, cooperative and no distress Lochia: appropriate Uterine Fundus: firm Incision: healing well DVT Evaluation: No evidence of DVT seen on physical exam.  Discharge Diagnoses: Term Pregnancy-delivered / mild ABL anemia - stable  Discharge Information: Date: 01/30/2013 Activity: pelvic rest Diet: routine Medications: PNV, Ibuprofen and Percocet and OTC iron and Tylenol  Condition: stable Instructions: refer to practice specific booklet Discharge to: home Follow-up Information   Follow up with Reika Callanan A, MD. Schedule an appointment as soon as possible for a visit in 6 weeks.   Contact information:   [address] [place] [address] [phone]       Newborn Data: Live born female  Birth Weight: 7 lb 2.1 oz (3235 g) APGAR: 7, 9  Home with mother.  Marlinda Mike 01/30/2013, 9:02 AM

## 2013-02-18 ENCOUNTER — Ambulatory Visit
Admission: RE | Admit: 2013-02-18 | Discharge: 2013-02-18 | Disposition: A | Discharge status: Home / Self Care | Payer: Commercial Managed Care - PPO | Source: Ambulatory Visit | Attending: Gastroenterology | Admitting: Gastroenterology

## 2013-02-18 DIAGNOSIS — R1013 Epigastric pain: Secondary | ICD-10-CM

## 2013-02-18 DIAGNOSIS — R0789 Other chest pain: Secondary | ICD-10-CM

## 2013-03-10 ENCOUNTER — Encounter: Payer: Self-pay | Admitting: Diagnostic Neuroimaging

## 2013-03-10 ENCOUNTER — Ambulatory Visit (INDEPENDENT_AMBULATORY_CARE_PROVIDER_SITE_OTHER): Payer: Commercial Managed Care - PPO | Admitting: Diagnostic Neuroimaging

## 2013-03-10 VITALS — BP 109/75 | HR 63 | Temp 98.8°F | Ht 67.0 in | Wt 228.0 lb

## 2013-03-10 DIAGNOSIS — R200 Anesthesia of skin: Secondary | ICD-10-CM

## 2013-03-10 DIAGNOSIS — R202 Paresthesia of skin: Secondary | ICD-10-CM

## 2013-03-10 DIAGNOSIS — R209 Unspecified disturbances of skin sensation: Secondary | ICD-10-CM

## 2013-03-10 NOTE — Progress Notes (Signed)
[facility]  PATIENT: Michelle Hobbs DOB: [birth year]  REFERRING CLINICIAN: Cousins HISTORY FROM: patient REASON FOR VISIT: new consult   HISTORICAL  CHIEF COMPLAINT:  Chief Complaint  Patient presents with  . Numbness    R leg  . Pain    R leg    HISTORY OF PRESENT ILLNESS:   27 year old right-handed female here for evaluation of postpartum numbness in the right leg.  01/28/2013, patient went to the hospital for scheduled induction and delivery. She had epidural anesthesia without complication. Postpartum, she noted some pain in her right leg. After she went home the symptoms continued. Initially she was having numbness and pain in her right leg on an almost constant basis. Now it has reduced to 2 or 3 times per day, lasting for 10 minutes at a time. Presently during our evaluation, patient is asymptomatic. She notes that symptoms tend to affect her more when she gets up from a laying position or when she is woken up from sleeping. She describes numbness and tingling in her right leg, around her knee, ended distally to her right foot. No significant low back pain. No problems with her left leg. No problems with her arms. Sometimes she feels that her right leg gives out. No fevers or chills. She has been using ibuprofen as needed for pain. She was on Percocet 5/325 previously.  REVIEW OF SYSTEMS: Full 14 system review of systems performed and notable only for swelling in legs, restless legs.  ALLERGIES: No Known Allergies  HOME MEDICATIONS: Outpatient Prescriptions Prior to Visit  Medication Sig Dispense Refill  . ibuprofen (ADVIL,MOTRIN) 600 MG tablet Take 1 tablet (600 mg total) by mouth every 6 (six) hours.  30 tablet  0  . oxyCODONE-acetaminophen (PERCOCET/ROXICET) 5-325 MG per tablet Take 1 tablet by mouth every 4 (four) hours as needed.  20 tablet  0  . Prenatal Vit-Fe Fumarate-FA (PRENATAL MULTIVITAMIN) TABS Take 1 tablet by mouth daily at 12 noon.        No facility-administered medications prior to visit.    PAST MEDICAL HISTORY: Past Medical History  Diagnosis Date  . Ectopic pregnancy march 2013    PAST SURGICAL HISTORY: Past Surgical History  Procedure Laterality Date  . Dilation and curettage of uterus      05/2011  . Pelvic laparoscopy  march 2013    left salpingectomy    FAMILY HISTORY: Family History  Problem Relation Age of Onset  . Hypertension Mother   . Diabetes Maternal Aunt   . Diabetes Paternal Aunt   . Cancer Paternal Grandfather 74    COLON    SOCIAL HISTORY:  History   Social History  . Marital Status: Single    Spouse Name: N/A    Number of Children: 1  . Years of Education: BA   Occupational History  .      She by [employer]   Social History Main Topics  . Smoking status: Never Smoker   . Smokeless tobacco: Never Used  . Alcohol Use: No     Comment: SOCIALLY  . Drug Use: No  . Sexually Active: Yes    Birth Control/ Protection: Condom   Other Topics Concern  . Not on file   Social History Narrative   Pt lives at home alone with newborn.   Caffeine Use: Rarely     PHYSICAL EXAM  Filed Vitals:   03/10/13 1505  BP: 109/75  Pulse: 63  Temp: 98.8 F (37.1 C)  TempSrc: Oral  Height: 5\' 7"  (1.702 m)  Weight: 228 lb (103.42 kg)    Not recorded    Body mass index is 35.7 kg/(m^2).  GENERAL EXAM: Patient is in no distress; straight leg raise negative.  CARDIOVASCULAR: Regular rate and rhythm, no murmurs, no carotid bruits  NEUROLOGIC: MENTAL STATUS: awake, alert, language fluent, comprehension intact, naming intact CRANIAL NERVE: no papilledema on fundoscopic exam, pupils equal and reactive to light, visual fields full to confrontation, extraocular muscles intact, no nystagmus, facial sensation and strength symmetric, uvula midline, shoulder shrug symmetric, tongue midline. MOTOR: normal bulk and tone, full strength in the BUE, BLE SENSORY: normal and symmetric to  light touch, pinprick, temperature, vibration COORDINATION: finger-nose-finger, fine finger movements normal REFLEXES: deep tendon reflexes present and symmetric GAIT/STATION: narrow based gait; able to walk on toes, heels and tandem; romberg is negative   DIAGNOSTIC DATA (LABS, IMAGING, TESTING) - I reviewed patient records, labs, notes, testing and imaging myself where available.  Lab Results  Component Value Date   WBC 9.6 01/29/2013   HGB 9.5* 01/29/2013   HCT 28.7* 01/29/2013   MCV 88.0 01/29/2013   PLT 172 01/29/2013   No results found for this basename: na, k, cl, co2, glucose, bun, creatinine, calcium, prot, albumin, ast, alt, alkphos, bilitot, gfrnonaa, gfraa   Lab Results  Component Value Date   CHOL 182 12/02/2011   Lab Results  Component Value Date   HGBA1C 5.1 12/02/2011   No results found for this basename: [id number]   Lab Results  Component Value Date   TSH 0.880 12/02/2011      ASSESSMENT AND PLAN  27 y.o. year old female  has a past medical history of Ectopic pregnancy (march 2013). here with postpartum, intermittent numbness of the right leg. Normal neurologic exam today.   DDx: could represent lumbar radiculitis versus piriformis syndrome and sciatic nerve irritation.   PLAN: Because symptoms are mild and intermittent, I recommend conservative management with physical therapy and ibuprofen as needed. If symptoms worsen in the future, we may consider MRI of the lumbar spine. I think epidural abscess, epidural hematoma, disc herniation or unlikely at this time.   Orders Placed This Encounter  Procedures  . Ambulatory referral to Physical Therapy     Suanne Marker, MD 03/10/2013, 4:31 PM Certified in Neurology, Neurophysiology and Neuroimaging  [facility] [address], [address] [place], [place] [address] [phone]

## 2013-06-28 ENCOUNTER — Other Ambulatory Visit: Payer: Self-pay | Admitting: Gynecology

## 2013-06-28 ENCOUNTER — Encounter: Payer: Self-pay | Admitting: Women's Health

## 2013-06-28 ENCOUNTER — Ambulatory Visit (INDEPENDENT_AMBULATORY_CARE_PROVIDER_SITE_OTHER): Payer: Medicaid Other | Admitting: Women's Health

## 2013-06-28 DIAGNOSIS — R350 Frequency of micturition: Secondary | ICD-10-CM

## 2013-06-28 DIAGNOSIS — N390 Urinary tract infection, site not specified: Secondary | ICD-10-CM

## 2013-06-28 DIAGNOSIS — Z113 Encounter for screening for infections with a predominantly sexual mode of transmission: Secondary | ICD-10-CM

## 2013-06-28 DIAGNOSIS — N912 Amenorrhea, unspecified: Secondary | ICD-10-CM

## 2013-06-28 LAB — HEPATITIS B SURFACE ANTIGEN: Hepatitis B Surface Ag: NEGATIVE

## 2013-06-28 LAB — URINALYSIS, ROUTINE W REFLEX MICROSCOPIC
Ketones, ur: NEGATIVE mg/dL
Nitrite: NEGATIVE
Specific Gravity, Urine: 1.025 (ref 1.005–1.030)
Urobilinogen, UA: 0.2 mg/dL (ref 0.0–1.0)

## 2013-06-28 LAB — WET PREP FOR TRICH, YEAST, CLUE
Clue Cells Wet Prep HPF POC: NONE SEEN
Yeast Wet Prep HPF POC: NONE SEEN

## 2013-06-28 LAB — PREGNANCY, URINE: Preg Test, Ur: NEGATIVE

## 2013-06-28 MED ORDER — NITROFURANTOIN MONOHYD MACRO 100 MG PO CAPS: 100.0000 mg | ORAL_CAPSULE | Freq: Two times a day (BID) | ORAL | Status: AC

## 2013-06-28 NOTE — Patient Instructions (Addendum)

## 2013-06-28 NOTE — Progress Notes (Signed)
Patient ID: Michelle Hobbs, female   DOB: [birth year], 27 y.o.   MRN: [id number] Presents with 2 days of vaginal burning, irritation and itching. Has tried cream wipes without relief. Denies discharge or urinary symptoms. Has had unprotected sex with same partner and would like STD screen. Vaginal delivery 01/28/2013 daughter who's doing well. Last cycle was lighter than usual.  Exam: Appears well, external genitalia slightly erythematous, speculum exam scant white discharge minimal erythema, wet prep negative. GC/Chlamydia culture taken UA: Too numerous to count white blood cells, 3-6 red blood cells, many bacteria. urine pregnancy: Negative.  UTI STD screen Contraception management  Plan: Macrobid 100 mg twice a day for 7 days. Was advised to call office if symptoms worsen or do not improve after antibiotic treatment. Discussed UTI prevention techniques, and condom use encouraged. Contraception options reviewed, interested in Mirena IUD. Will check coverage. GC/Chlamydia culture pending, HIV, hep B, C., RPR.

## 2013-06-29 LAB — GC/CHLAMYDIA PROBE AMP: CT Probe RNA: NEGATIVE

## 2013-06-30 LAB — URINE CULTURE: Colony Count: NO GROWTH

## 2013-06-30 LAB — URINALYSIS, MICROSCOPIC ONLY

## 2013-07-04 ENCOUNTER — Other Ambulatory Visit: Payer: Self-pay | Admitting: *Deleted

## 2013-07-04 DIAGNOSIS — N390 Urinary tract infection, site not specified: Secondary | ICD-10-CM

## 2014-01-12 IMAGING — US US ABDOMEN COMPLETE
1 series · 14 of 25 positions shown · non-contrast
Comparison: None.

CLINICAL DATA: Epigastric abdominal pain and chest pressure.

COMPLETE ABDOMINAL ULTRASOUND

[Series 1: us abdomen complete · 0.39mm/px · 14 of 76 slices shown]
[im 1/76]
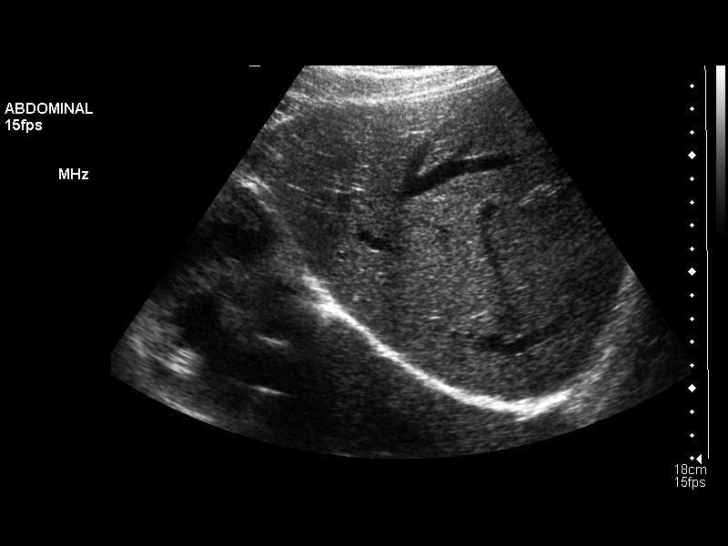
[im 7/76]
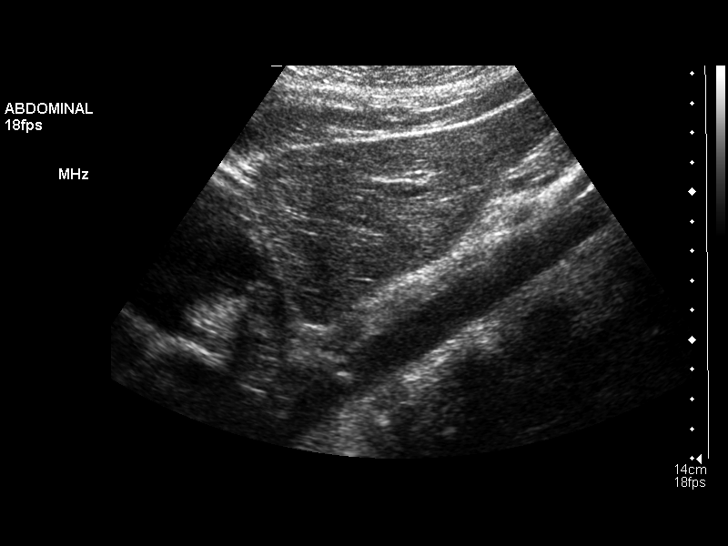
[im 13/76]
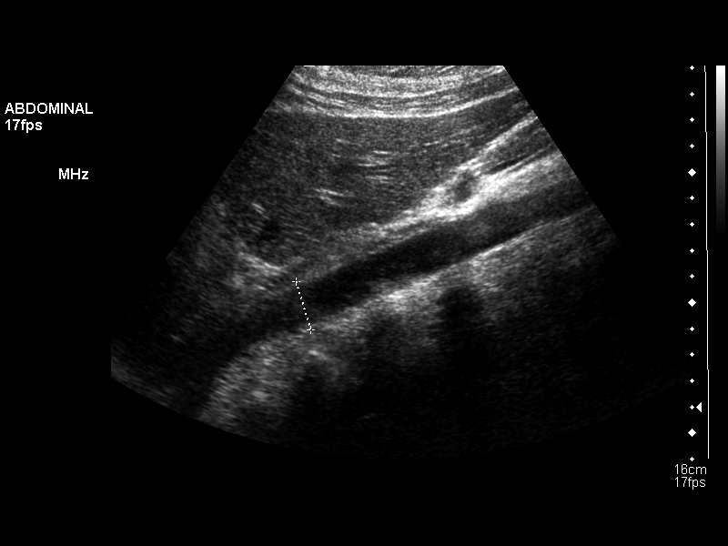
[im 19/76]
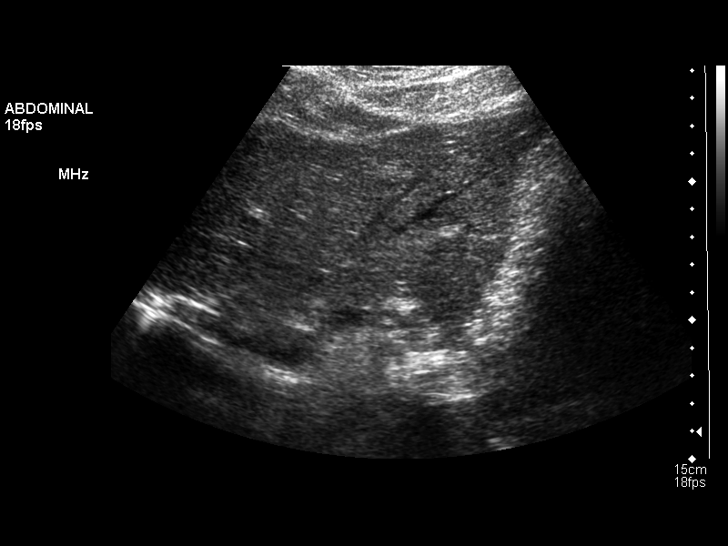
[im 26/76]
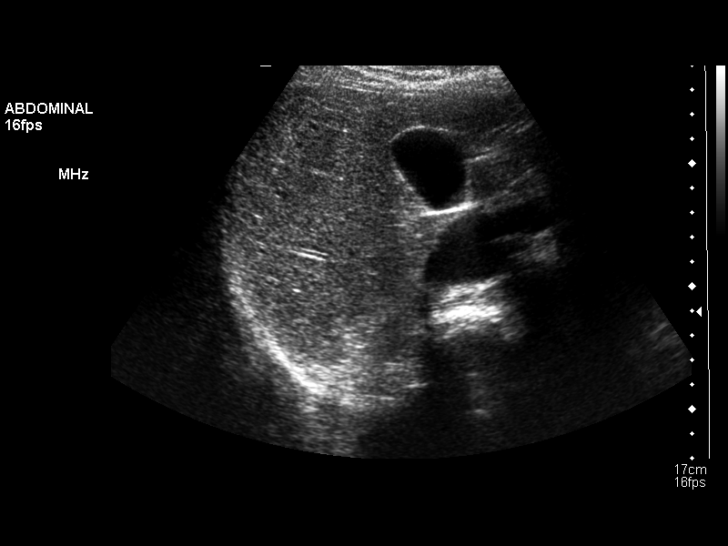
[im 29/76]
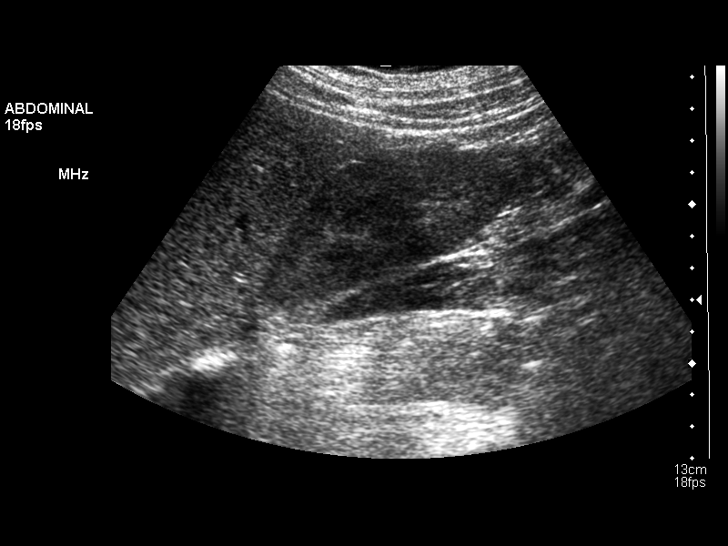
[im 35/76]
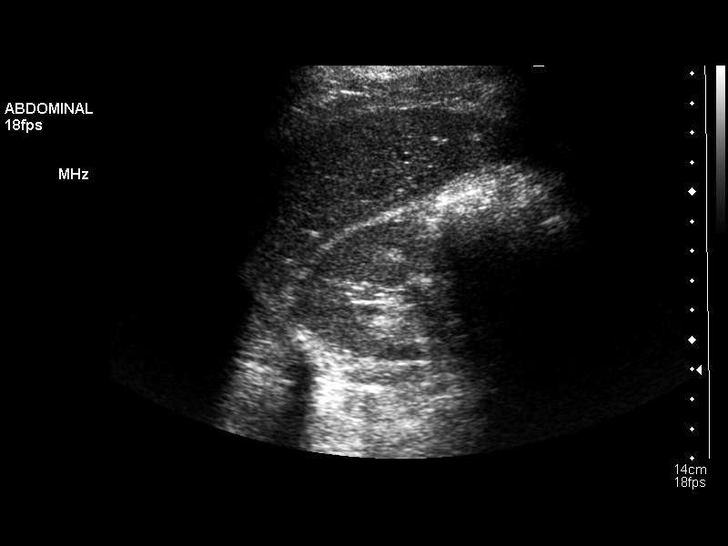
[im 41/76]
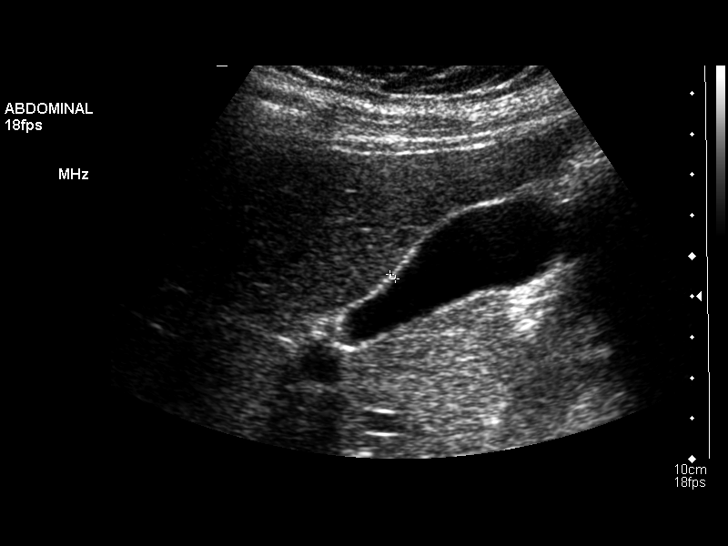
[im 47/76]
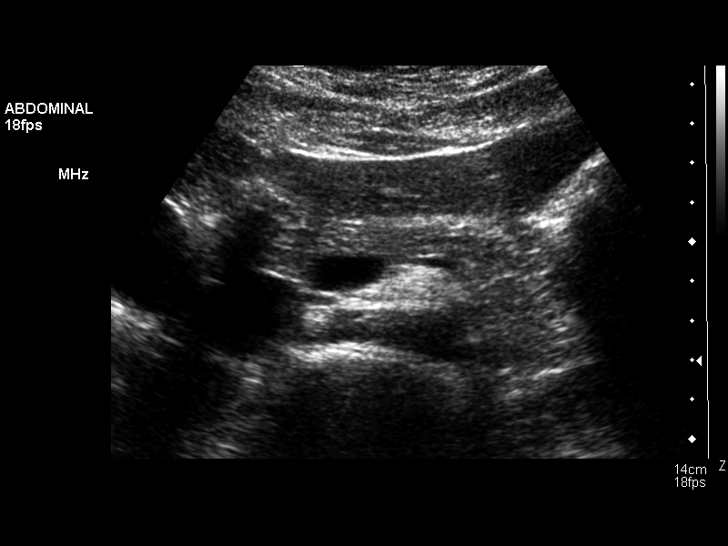
[im 51/76]
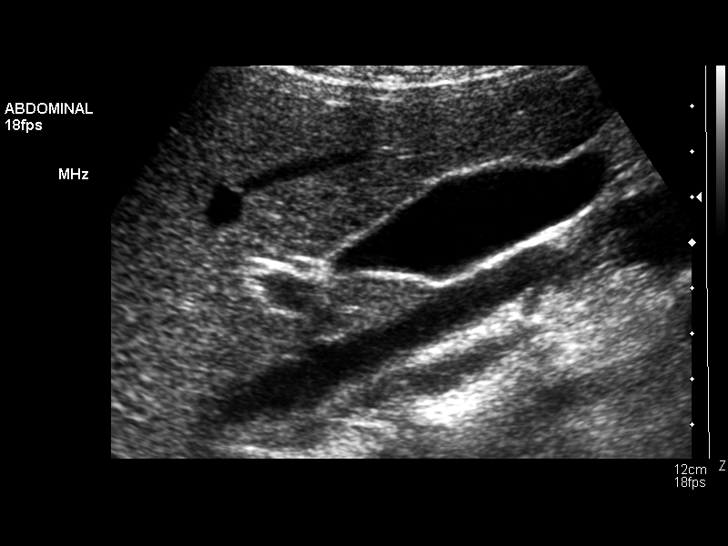
[im 57/76]
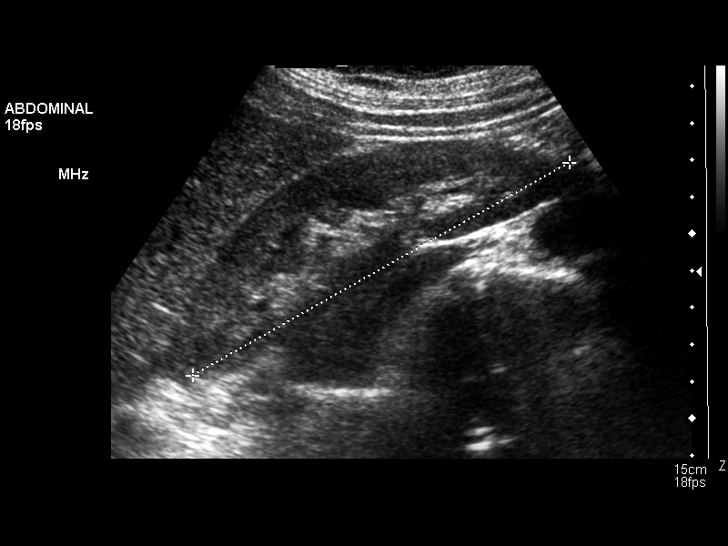
[im 63/76]
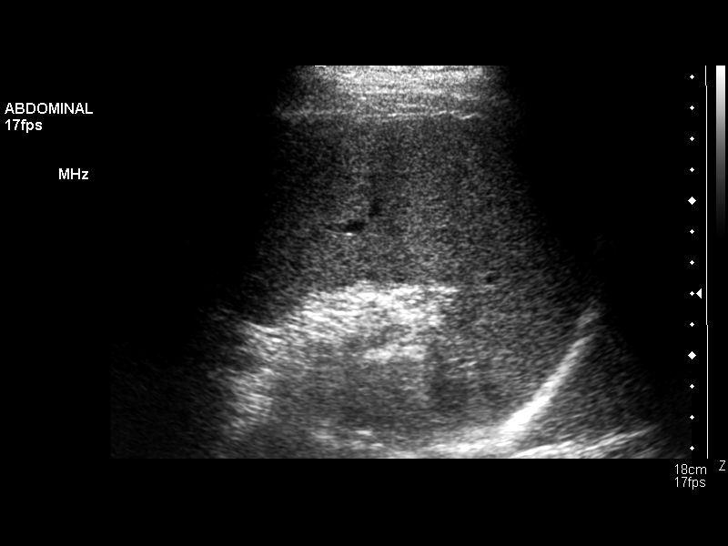
[im 69/76]
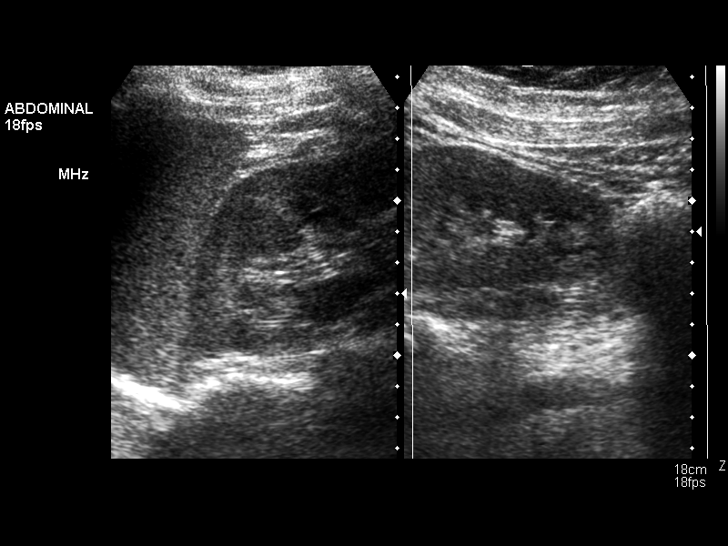
[im 76/76]
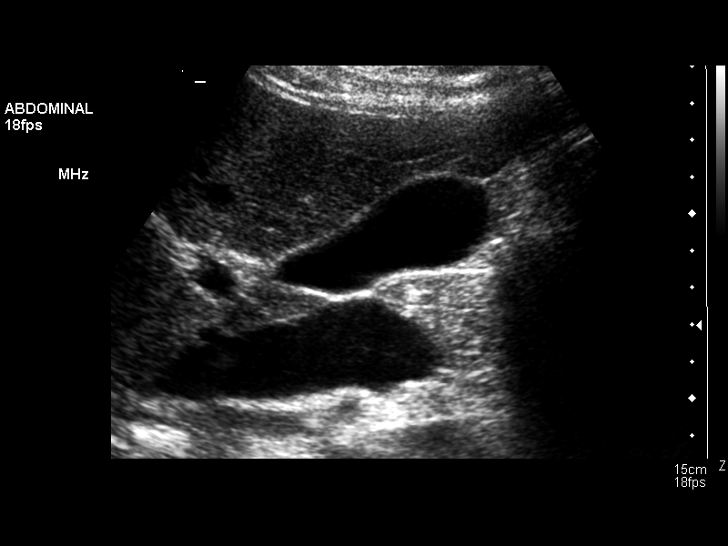

[14 of 25 positions shown; findings below may reference images not displayed]

FINDINGS: Gallbladder:  No gallstones, gallbladder wall thickening, or
pericholecystic fluid.

Common bile duct:  Measures 2 mm in diameter, within normal limits.

Liver:  No focal lesion identified.  Within normal limits in
parenchymal echogenicity.

IVC:  Appears normal.

Pancreas:  No focal abnormality seen.

Spleen:  Measures 11.9 cm craniocaudad and appears normal.

Right Kidney:  Measures 12.1 cm in length and appears normal.

Left Kidney:  Measures 11.5 cm in length and appears normal.

Abdominal aorta:  No aneurysm identified.
IMPRESSION: 1.  Normal sonographic appearance the upper abdomen.  No specific
finding to explain the patient's chest and epigastric pain.

## 2014-08-14 ENCOUNTER — Encounter: Payer: Self-pay | Admitting: Women's Health

## 2014-10-18 ENCOUNTER — Emergency Department (HOSPITAL_COMMUNITY): Payer: 59

## 2014-10-18 ENCOUNTER — Encounter (HOSPITAL_COMMUNITY): Payer: Self-pay | Admitting: Emergency Medicine

## 2014-10-18 ENCOUNTER — Emergency Department (HOSPITAL_COMMUNITY)
Admission: EM | Admit: 2014-10-18 | Discharge: 2014-10-18 | Disposition: A | Discharge status: Home / Self Care | Payer: 59 | Attending: Emergency Medicine | Admitting: Emergency Medicine

## 2014-10-18 DIAGNOSIS — R0789 Other chest pain: Secondary | ICD-10-CM | POA: Insufficient documentation

## 2014-10-18 DIAGNOSIS — Z3202 Encounter for pregnancy test, result negative: Secondary | ICD-10-CM | POA: Insufficient documentation

## 2014-10-18 DIAGNOSIS — R0981 Nasal congestion: Secondary | ICD-10-CM

## 2014-10-18 DIAGNOSIS — R079 Chest pain, unspecified: Secondary | ICD-10-CM

## 2014-10-18 DIAGNOSIS — Z79899 Other long term (current) drug therapy: Secondary | ICD-10-CM | POA: Insufficient documentation

## 2014-10-18 DIAGNOSIS — R51 Headache: Secondary | ICD-10-CM | POA: Diagnosis not present

## 2014-10-18 LAB — BASIC METABOLIC PANEL
Anion gap: 8 (ref 5–15)
BUN: 16 mg/dL (ref 6–23)
CHLORIDE: 105 meq/L (ref 96–112)
CO2: 24 mmol/L (ref 19–32)
CREATININE: 1.07 mg/dL (ref 0.50–1.10)
Calcium: 9 mg/dL (ref 8.4–10.5)
GFR calc non Af Amer: 69 mL/min — ABNORMAL LOW (ref >90)
GFR, EST AFRICAN AMERICAN: 80 mL/min — AB (ref >90)
GLUCOSE: 66 mg/dL — AB (ref 70–99)
Potassium: 3.6 mmol/L (ref 3.5–5.1)
Sodium: 137 mmol/L (ref 135–145)

## 2014-10-18 LAB — URINALYSIS, ROUTINE W REFLEX MICROSCOPIC
BILIRUBIN URINE: NEGATIVE
GLUCOSE, UA: NEGATIVE mg/dL
KETONES UR: NEGATIVE mg/dL
Nitrite: NEGATIVE
PROTEIN: 30 mg/dL — AB
Specific Gravity, Urine: 1.025 (ref 1.005–1.030)
Urobilinogen, UA: 0.2 mg/dL (ref 0.0–1.0)
pH: 5 (ref 5.0–8.0)

## 2014-10-18 LAB — URINE MICROSCOPIC-ADD ON

## 2014-10-18 LAB — CBC
HEMATOCRIT: 41.1 % (ref 36.0–46.0)
Hemoglobin: 13.6 g/dL (ref 12.0–15.0)
MCH: 28.3 pg (ref 26.0–34.0)
MCHC: 33.1 g/dL (ref 30.0–36.0)
MCV: 85.6 fL (ref 78.0–100.0)
Platelets: 190 10*3/uL (ref 150–400)
RBC: 4.8 MIL/uL (ref 3.87–5.11)
RDW: 12.7 % (ref 11.5–15.5)
WBC: 7.6 10*3/uL (ref 4.0–10.5)

## 2014-10-18 LAB — POC URINE PREG, ED: Preg Test, Ur: NEGATIVE

## 2014-10-18 LAB — I-STAT TROPONIN, ED: TROPONIN I, POC: 0 ng/mL (ref 0.00–0.08)

## 2014-10-18 MED ORDER — LORATADINE-PSEUDOEPHEDRINE ER 5-120 MG PO TB12: 1.0000 | ORAL_TABLET | Freq: Two times a day (BID) | ORAL | Status: AC

## 2014-10-18 MED ORDER — IBUPROFEN 200 MG PO TABS
600.0000 mg | ORAL_TABLET | Freq: Once | ORAL | Status: AC
  Administered 2014-10-18: 600 mg via ORAL
  Filled 2014-10-18: qty 3

## 2014-10-18 MED ORDER — CYCLOBENZAPRINE HCL 5 MG PO TABS: 5.0000 mg | ORAL_TABLET | Freq: Three times a day (TID) | ORAL | Status: AC | PRN

## 2014-10-18 NOTE — ED Notes (Signed)
Pt reports mid sternal chest pain everyday for the past week that increases with movement. Pt also c/o migraines.

## 2014-10-18 NOTE — ED Notes (Signed)
Pt a/o x 4 on d/c with steady gait. 

## 2014-10-18 NOTE — Discharge Instructions (Signed)
°Emergency Department Resource Guide °1) Find a Doctor and Pay Out of Pocket °Although you won't have to find out who is covered by your insurance plan, it is a good idea to ask around and get recommendations. You will then need to call the office and see if the doctor you have chosen will accept you as a new patient and what types of options they offer for patients who are self-pay. Some doctors offer discounts or will set up payment plans for their patients who do not have insurance, but you will need to ask so you aren't surprised when you get to your appointment. ° °2) Contact Your Local Health Department °Not all health departments have doctors that can see patients for sick visits, but many do, so it is worth a call to see if yours does. If you don't know where your local health department is, you can check in your phone book. The CDC also has a tool to help you locate your state's health department, and many state websites also have listings of all of their local health departments. ° °3) Find a Walk-in Clinic °If your illness is not likely to be very severe or complicated, you may want to try a walk in clinic. These are popping up all over the country in pharmacies, drugstores, and shopping centers. They're usually staffed by nurse practitioners or physician assistants that have been trained to treat common illnesses and complaints. They're usually fairly quick and inexpensive. However, if you have serious medical issues or chronic medical problems, these are probably not your best option. ° °No Primary Care Doctor: °- Call [facility] at  832-8000 - they can help you locate a primary care doctor that  accepts your insurance, provides certain services, etc. °- Physician Referral Service- 1-800-533-3463 ° °Chronic Pain Problems: °Organization         Address  Phone   Notes  °[facility]  (336) [phone] Patients need to be referred by their primary care doctor.  ° °Medication  Assistance: °Organization         Address  Phone   Notes  °[place] [facility] [address] °[place], [place] [address] (336) 641-8030 --Must be a resident of [place] °-- Must have NO insurance coverage whatsoever (no Medicaid/ Medicare, etc.) °-- The pt. MUST have a primary care doctor that directs their care regularly and follows them in the community °  °[facility]  (866) 331-1348   °[employer]  (888) 892-1162   ° °Agencies that provide inexpensive medical care: °Organization         Address  Phone   Notes  °[facility]  (336) 832-8035   °[facility]    (336) 832-7272   °[facility] [address] °[place], [place] [address] (336) 832-4777   °[facility] [address], °[place] (336) 271-4999   °[facility]    (336) 373-0678   °[facility]    (336) 272-1050   °[facility] ° [address], [place] Phone:  (336) 832-4444, Fax:  (336) 832-4440 Hours of Operation:  9 am - 6 pm, M-F.  Also accepts Medicaid/Medicare and self-pay.  °[facility] ° [address], [place] Phone: (336) [phone], Fax: (336) [phone]. Hours of Operation:  8:30 am - 5:30 pm, M-F.  Also accepts Medicaid and self-pay.  °[facility] [place] [address], [place] Phone: (336) 878-6027   °[facility] [address], [place], [place] (336)723-1848, Ext. 123 Mondays & Thursdays: 7-9 AM.  First 15 patients are seen on a first come, first serve basis. °  ° °Medicaid-accepting [place] Providers: ° °Organization         Address  Phone   Notes  °[facility] [address], [place] (336) 641-2100 Also accepts self-pay patients.  °[facility] [address], [place] ° (336) 856-9996   °[facility] [address], [place]  (336) 288-8857   °[facility] [address] [place] [address], [place] (336) 299-7000   °Veita Bland [address], [place]  ° (336) 373-1557 Only accepts  Access Medicaid patients after they have their name applied to their card.  ° °Self-Pay (no insurance) in [place]: ° °Organization         Address  Phone   Notes  °Sickle Cell Patients, [facility] [address], [place] (336) 832-1970   °[facility] [address], [place] (336) 832-4400   °[facility] [place] ° [address], [place] (336) 992-4800   °[facility]/Dr. Osei-Bonsu ° [address] [place] [address], [place] or [address], [place] (336) [phone] Phone number for both [place] and [place] locations is the same.  °[facility] [address], [place] (336) 299-0000   °[facility] [address] [place] [address], [place] or [address] (336) 852-7530 °(336) 878-2260   °[facility] [address], [place] (336) 350-1642, phone; (336) 294-5005, fax Sees patients 1st and 3rd Saturday of every month.  Must not qualify for public or private insurance (i.e. Medicaid, Medicare, [place] Health Choice, Veterans' Benefits) • Household income should be no more than 200% of the poverty level •The clinic cannot treat you if you are pregnant or think you are pregnant • Sexually transmitted diseases are not treated at the clinic.  ° ° °Dental Care: °Organization         Address  Phone  Notes  °[place] [facility] [address], [place] (336) 641-6152 Accepts children up to age 21 who are enrolled in Medicaid or [place] Health Choice; pregnant women with a Medicaid card; and children who have applied for Medicaid or [place] Health Choice, but were declined, whose parents can pay a reduced fee at time of service.  °[place]  [facility] [place]  [address], [place] (336) 641-7733 Accepts children up to age 21 who are enrolled in Medicaid or [place] Health Choice; pregnant women with a Medicaid card; and children who have applied for Medicaid or [place] Health Choice, but were declined, whose parents can pay a reduced fee at time of service.  °[facility] ° [address], [place] (336) 641-4533 Patients are seen by appointment only. Walk-ins are not accepted. [facility] will see patients 18 years of age and older. °Monday - Tuesday (8am-5pm) °Most Wednesdays (8:30-5pm) °$30 per visit, cash only  °[facility] ° [address], [place] (336) 641-4533 Patients are seen by appointment only. Walk-ins are not accepted. [facility] will see patients 18 years of age and older. °One   Wednesday Evening (Monthly: Volunteer Based).  $30 per visit, cash only  °[facility]  (919) 537-3737 for adults; Children under age 4, call Graduate Pediatric Dentistry at ([phone]) [phone]. Children aged 4-14, please call (919) [phone] to request a pediatric application. ° Dental services are provided in all areas of dental care including fillings, crowns and bridges, complete and partial dentures, implants, gum treatment, root canals, and extractions. Preventive care is also provided. Treatment is provided to both adults and children. °Patients are selected via a lottery and there is often a waiting list. °  °[facility] [address], °[place] ° (336) 763-8833 [email or web address] °  °[facility] [address], [place], [place] (336)723-1848, Ext. 123 Second and Fourth Thursday of each month, opens at 6:30 AM; Clinic ends at 9 AM.  Patients are seen on a first-come first-served basis, and a limited number are seen during each clinic.  ° °[facility] ° [address], [place], [place] (336) 723-7904    Eligibility Requirements °You must have lived in [place], [place], or [place] counties for at least the last three months. °  You cannot be eligible for state or federal sponsored healthcare insurance, including Veterans Administration, Medicaid, or Medicare. °  You generally cannot be eligible for healthcare insurance through your employer.  °  How to apply: °Eligibility screenings are held every Tuesday and Wednesday afternoon from 1:00 pm until 4:00 pm. You do not need an appointment for the interview!  °[facility] [address], [place], [place] 336-631-2330   °[facility]  336-342-8273   °[facility]  336-703-3100   °[facility]  336-570-6415   ° °Behavioral Health Resources in the Community: °Intensive Outpatient Programs °Organization         Address  Phone  Notes  °[facility] [address], [place], [place] 336-878-6098   °[facility] [address], [place], [place] 336-832-9800   °[facility] [address], [place], [place] ° 336-882-2125   °[facility] [address],  °[place], [place] 1-800-853-5163 or 336-641-4981   °Substance Abuse Resources °Organization         Address  Phone  Notes  °[facility]  336-882-2125   °[facility]  336-784-9470   °[facility]  336-285-9073   °[facility]  336-845-3988   °Residential & Outpatient Substance Abuse Program  1-800-659-3381   °Psychological Services °Organization         Address  Phone  Notes  °[facility]  336- 832-9600   °[facility]  336- 378-7881   °[facility] [address], [place] 1-800-853-5163 or 336-641-4981   ° °Mobile Crisis Teams °Organization         Address  Phone  Notes  °[facility], Mobile Crisis Care Unit  1-877-626-1772   °Assertive °[facility] ° [address]  [place], [place] 336-834-9664   °Sharon DeEsch [address] °[place]  336-554-5454   ° °Self-Help/Support Groups °Organization         Address  Phone             Notes  °[facility] - variety of support groups  336- 373-1402 Call for more information  °Narcotics Anonymous (NA), [facility] [address], °[place]   2 meetings at this location  ° °  Residential Treatment Programs °Organization         Address  Phone  Notes  °[facility] [address],    °[place]  1-866-801-8205   °[facility] ° [address], [place], [place] 704-293-8524   °[facility] [address], [place] 336-845-3988 Admissions: 8am-3pm M-F  °[facility] [address]    °[place], [place] 336-841-1104   °[facility] [address], [place], [place] 336-379-7146   °[facility] [address]  °[place], [place] 336-285-9073   °[facility] - Intensive Outpatient [address], [place], [place] 336-852-3033   °[facility] [address]  °[place], [place] 1-877-615-2722 or 336-784-9470   °[facility] [address] [place], [place] 336-227-7417 Accepts Medicaid  °[facility] [address]  °[place] 1-800-659-3381 Substance Abuse/Addiction Treatment  ° °[place] Behavioral Health Resources °Organization         Address  Phone  Notes  °[facility]  (888) 581-9988   °Julie Brannon, PhD [address] [place], [place]   (336) 349-5553 or (336) 951-0000   °[facility]   [address] °[place], [place] (336) 349-4454   °[facility] [address], [place], [place] (336) 342-8316 Insurance/Medicaid/sponsorship through [facility]  °[facility] [address]                                    [place], [place] (336) 342-8316 Therapy/tele-psych/case    °[facility] [address]  ° [place], [place] (336) 349-2233    °Dr. Arfeen  (336) 349-4544   °[facility] [place]  [employer] [place] [facility] 1) [address], [place] °2) [address], [place] °3)  [address], [place] (336) 349-3220 °(336) 342-7768 ° °(336) 342-8140   °[place] Child Abuse [facility] (336) 342-1394 or (336) 342-3537 (After Hours)    ° ° °

## 2014-10-18 NOTE — ED Provider Notes (Signed)
CSN: [id number]     Arrival date & time 10/18/14  [id number] History   None    Chief Complaint  Patient presents with  . Chest Pain  . Headache     (Consider location/radiation/quality/duration/timing/severity/associated sxs/prior Treatment) HPI Comments: Morbidly obese, African-American female with a history of seasonal allergies is complaining of intermittent headache, worse when she bends forward breathing resolves when she stands upright.  She's not taken any medication for her congestion.  Denies any fever, visual disturbance is also complaining of sternal chest discomfort, worse with deep breath or movement.  She again has not taken any medication for her discomfort.  She states ibuprofen doesn't work for her  Patient is a 29 y.o. female presenting with chest pain and headaches. The history is provided by the patient.  Chest Pain Pain location:  Substernal area Pain quality: aching   Pain radiates to:  Does not radiate Pain radiates to the back: no   Pain severity:  Mild Onset quality:  Unable to specify Timing:  Intermittent Chronicity:  New Context: lifting, movement and raising an arm   Relieved by:  None tried Worsened by:  Nothing tried Ineffective treatments:  None tried Associated symptoms: headache   Associated symptoms: no cough, no dizziness and no fever   Headache Associated symptoms: congestion   Associated symptoms: no cough, no dizziness, no drainage, no fever and no photophobia     Past Medical History  Diagnosis Date  . Ectopic pregnancy march 2013   Past Surgical History  Procedure Laterality Date  . Dilation and curettage of uterus      05/2011  . Pelvic laparoscopy  march 2013    left salpingectomy   Family History  Problem Relation Age of Onset  . Hypertension Mother   . Diabetes Maternal Aunt   . Diabetes Paternal Aunt   . Cancer Paternal Grandfather 2283    COLON   History  Substance Use Topics  . Smoking status: Never Smoker   . Smokeless  tobacco: Never Used  . Alcohol Use: No     Comment: SOCIALLY   OB History    Gravida Para Term Preterm AB TAB SAB Ectopic Multiple Living   5 1 1  4  1 1  1      Review of Systems  Constitutional: Negative for fever.  HENT: Positive for congestion. Negative for postnasal drip and rhinorrhea.   Eyes: Negative for photophobia and visual disturbance.  Respiratory: Negative for cough.   Cardiovascular: Positive for chest pain.  Skin: Negative for wound.  Neurological: Positive for headaches. Negative for dizziness.  All other systems reviewed and are negative.     Allergies  Review of patient's allergies indicates no known allergies.  Home Medications   Prior to Admission medications   Medication Sig Start Date End Date Taking? Authorizing Provider  cyclobenzaprine (FLEXERIL) 5 MG tablet Take 1 tablet (5 mg total) by mouth 3 (three) times daily as needed for muscle spasms. 10/18/14   Arman FilterGail K Kiyani Jernigan, NP  ibuprofen (ADVIL,MOTRIN) 600 MG tablet Take 1 tablet (600 mg total) by mouth every 6 (six) hours. Patient not taking: Reported on 10/18/2014 01/30/13   Marlinda Mikeanya Bailey, CNM  loratadine-pseudoephedrine (CLARITIN-D 12 HOUR) 5-120 MG per tablet Take 1 tablet by mouth 2 (two) times daily. 10/18/14   Arman FilterGail K Hutchinson Isenberg, NP  oxyCODONE-acetaminophen (PERCOCET/ROXICET) 5-325 MG per tablet Take 1 tablet by mouth every 4 (four) hours as needed. Patient not taking: Reported on 10/18/2014 01/30/13   Marlinda Mikeanya Bailey,  CNM  Prenatal Vit-Fe Fumarate-FA (PRENATAL MULTIVITAMIN) TABS Take 1 tablet by mouth daily at 12 noon.    Historical Provider, MD   BP 111/58 mmHg  Pulse 64  Temp(Src) 98.1 F (36.7 C) (Oral)  Resp 17  Ht  (1.702 m)  Wt 248 lb (112.492 kg)  BMI 38.83 kg/m2  SpO2 99%  LMP 09/24/2014 Physical Exam  Constitutional: She is oriented to person, place, and time. She appears well-developed and well-nourished.  HENT:  Head: Normocephalic.  Right Ear: External ear normal.  Left Ear: External ear  normal.  Eyes: Pupils are equal, round, and reactive to light.  Neck: Normal range of motion.  Cardiovascular: Normal rate.   Pulmonary/Chest: Effort normal. She exhibits tenderness.  Musculoskeletal: Normal range of motion. She exhibits no edema or tenderness.  Neurological: She is alert and oriented to person, place, and time.  Skin: Skin is warm and dry.  Nursing note and vitals reviewed.   ED Course  Procedures (including critical care time) Labs Review Labs Reviewed  BASIC METABOLIC PANEL - Abnormal; Notable for the following:    Glucose, Bld 66 (*)    GFR calc non Af Amer 69 (*)    GFR calc Af Amer 80 (*)    All other components within normal limits  CBC  URINALYSIS, ROUTINE W REFLEX MICROSCOPIC  I-STAT TROPOININ, ED  POC URINE PREG, ED    Imaging Review Dg Chest 2 View  10/18/2014   CLINICAL DATA:  Right-sided chest pain and pressure for 2 days.  EXAM: CHEST  2 VIEW  COMPARISON:  01/02/2010  FINDINGS: The heart size and mediastinal contours are within normal limits. Both lungs are clear. The visualized skeletal structures are unremarkable.  IMPRESSION: No active cardiopulmonary disease.   Electronically Signed   By: Burman Nieves M.D.   On: 10/18/2014 03:11     EKG Interpretation   Date/Time:  Wednesday October 18 2014 02:42:52 EST Ventricular Rate:  72 PR Interval:  124 QRS Duration: 82 QT Interval:  384 QTC Calculation: 420 R Axis:   29 Text Interpretation:  Normal sinus rhythm Normal ECG Confirmed by Erroll Luna [phone]) on 10/18/2014 3:21:55 AM      MDM  Prescribe muscle relaxer and decongestant for her seasonal allergy Final diagnoses:  Chest wall discomfort  Sinus congestion         Arman Filter, NP 10/18/14 6045  Tomasita Crumble, MD 10/18/14 [phone]

## 2016-10-17 DIAGNOSIS — Z6838 Body mass index (BMI) 38.0-38.9, adult: Secondary | ICD-10-CM | POA: Diagnosis not present

## 2016-10-17 DIAGNOSIS — R87615 Unsatisfactory cytologic smear of cervix: Secondary | ICD-10-CM | POA: Diagnosis not present

## 2016-10-17 DIAGNOSIS — N760 Acute vaginitis: Secondary | ICD-10-CM | POA: Diagnosis not present

## 2016-10-17 DIAGNOSIS — Z1159 Encounter for screening for other viral diseases: Secondary | ICD-10-CM | POA: Diagnosis not present

## 2016-10-17 DIAGNOSIS — E559 Vitamin D deficiency, unspecified: Secondary | ICD-10-CM | POA: Diagnosis not present

## 2016-10-17 DIAGNOSIS — Z114 Encounter for screening for human immunodeficiency virus [HIV]: Secondary | ICD-10-CM | POA: Diagnosis not present

## 2016-10-17 DIAGNOSIS — Z113 Encounter for screening for infections with a predominantly sexual mode of transmission: Secondary | ICD-10-CM | POA: Diagnosis not present

## 2016-10-17 DIAGNOSIS — R87610 Atypical squamous cells of undetermined significance on cytologic smear of cervix (ASC-US): Secondary | ICD-10-CM | POA: Diagnosis not present

## 2016-10-17 DIAGNOSIS — Z01419 Encounter for gynecological examination (general) (routine) without abnormal findings: Secondary | ICD-10-CM | POA: Diagnosis not present

## 2016-11-05 DIAGNOSIS — Z6839 Body mass index (BMI) 39.0-39.9, adult: Secondary | ICD-10-CM | POA: Diagnosis not present

## 2016-11-05 DIAGNOSIS — E669 Obesity, unspecified: Secondary | ICD-10-CM | POA: Diagnosis not present

## 2016-12-11 DIAGNOSIS — O26891 Other specified pregnancy related conditions, first trimester: Secondary | ICD-10-CM | POA: Diagnosis not present

## 2016-12-11 DIAGNOSIS — Z3A08 8 weeks gestation of pregnancy: Secondary | ICD-10-CM | POA: Insufficient documentation

## 2016-12-11 DIAGNOSIS — R1031 Right lower quadrant pain: Secondary | ICD-10-CM | POA: Diagnosis not present

## 2016-12-11 DIAGNOSIS — N898 Other specified noninflammatory disorders of vagina: Secondary | ICD-10-CM | POA: Diagnosis not present

## 2016-12-11 DIAGNOSIS — Z3A01 Less than 8 weeks gestation of pregnancy: Secondary | ICD-10-CM | POA: Diagnosis not present

## 2016-12-12 ENCOUNTER — Encounter (HOSPITAL_COMMUNITY): Payer: Self-pay

## 2016-12-12 ENCOUNTER — Emergency Department (HOSPITAL_COMMUNITY): Payer: BLUE CROSS/BLUE SHIELD

## 2016-12-12 ENCOUNTER — Emergency Department (HOSPITAL_COMMUNITY)
Admission: EM | Admit: 2016-12-12 | Discharge: 2016-12-12 | Disposition: A | Discharge status: Home / Self Care | Payer: BLUE CROSS/BLUE SHIELD | Attending: Emergency Medicine | Admitting: Emergency Medicine

## 2016-12-12 DIAGNOSIS — Z3A01 Less than 8 weeks gestation of pregnancy: Secondary | ICD-10-CM | POA: Diagnosis not present

## 2016-12-12 DIAGNOSIS — O26891 Other specified pregnancy related conditions, first trimester: Secondary | ICD-10-CM | POA: Diagnosis not present

## 2016-12-12 DIAGNOSIS — R1031 Right lower quadrant pain: Secondary | ICD-10-CM | POA: Diagnosis not present

## 2016-12-12 DIAGNOSIS — R109 Unspecified abdominal pain: Secondary | ICD-10-CM

## 2016-12-12 DIAGNOSIS — O26899 Other specified pregnancy related conditions, unspecified trimester: Secondary | ICD-10-CM

## 2016-12-12 LAB — URINALYSIS, ROUTINE W REFLEX MICROSCOPIC
BILIRUBIN URINE: NEGATIVE
Bacteria, UA: NONE SEEN
Glucose, UA: NEGATIVE mg/dL
KETONES UR: NEGATIVE mg/dL
Nitrite: NEGATIVE
Protein, ur: NEGATIVE mg/dL
Specific Gravity, Urine: 1.021 (ref 1.005–1.030)
pH: 5 (ref 5.0–8.0)

## 2016-12-12 LAB — WET PREP, GENITAL
SPERM: NONE SEEN
Trich, Wet Prep: NONE SEEN
YEAST WET PREP: NONE SEEN

## 2016-12-12 LAB — COMPREHENSIVE METABOLIC PANEL
ALT: 34 U/L (ref 14–54)
ANION GAP: 13 (ref 5–15)
AST: 27 U/L (ref 15–41)
Albumin: 3.8 g/dL (ref 3.5–5.0)
Alkaline Phosphatase: 62 U/L (ref 38–126)
BUN: 11 mg/dL (ref 6–20)
CHLORIDE: 100 mmol/L — AB (ref 101–111)
CO2: 23 mmol/L (ref 22–32)
Calcium: 9.7 mg/dL (ref 8.9–10.3)
Creatinine, Ser: 0.96 mg/dL (ref 0.44–1.00)
GFR calc non Af Amer: >60 mL/min (ref >60)
Glucose, Bld: 67 mg/dL (ref 65–99)
POTASSIUM: 3.4 mmol/L — AB (ref 3.5–5.1)
SODIUM: 136 mmol/L (ref 135–145)
Total Bilirubin: 0.4 mg/dL (ref 0.3–1.2)
Total Protein: 7.6 g/dL (ref 6.5–8.1)

## 2016-12-12 LAB — CBC
HEMATOCRIT: 39.1 % (ref 36.0–46.0)
Hemoglobin: 13 g/dL (ref 12.0–15.0)
MCH: 28.1 pg (ref 26.0–34.0)
MCHC: 33.2 g/dL (ref 30.0–36.0)
MCV: 84.4 fL (ref 78.0–100.0)
Platelets: 206 10*3/uL (ref 150–400)
RBC: 4.63 MIL/uL (ref 3.87–5.11)
RDW: 12.4 % (ref 11.5–15.5)
WBC: 7.2 10*3/uL (ref 4.0–10.5)

## 2016-12-12 LAB — ABO/RH: ABO/RH(D): B POS

## 2016-12-12 LAB — GC/CHLAMYDIA PROBE AMP (~~LOC~~) NOT AT ARMC
Chlamydia: NEGATIVE
NEISSERIA GONORRHEA: NEGATIVE

## 2016-12-12 LAB — HCG, QUANTITATIVE, PREGNANCY: hCG, Beta Chain, Quant, S: 147536 m[IU]/mL — ABNORMAL HIGH (ref <5)

## 2016-12-12 LAB — TYPE AND SCREEN
ABO/RH(D): B POS
ANTIBODY SCREEN: NEGATIVE

## 2016-12-12 LAB — LIPASE, BLOOD: LIPASE: 35 U/L (ref 11–51)

## 2016-12-12 MED ORDER — ACETAMINOPHEN 500 MG PO TABS: 500.0000 mg | ORAL_TABLET | Freq: Four times a day (QID) | ORAL | 0 refills | Status: AC | PRN

## 2016-12-12 NOTE — ED Triage Notes (Addendum)
Pt endorses right lower abd pain/pelvic pain that began 6 weeks ago with constipation, hematuria, and constant nausea. Denies vaginal discharge. VSS. LMP 10/28/16 and pt states "i've used 5 home pregnancy tests and they've all been negative.

## 2016-12-12 NOTE — ED Provider Notes (Signed)
[facility]-EMERGENCY DEPT Provider Note   CSN: [id number] Arrival date & time: 12/11/16  2350    History   Chief Complaint Chief Complaint  Patient presents with  . Abdominal Pain    HPI Michelle Hobbs is a 31 y.o. female.  31 y/o [id number] female presents to the ED for right lower abdominal and pelvic pain 6 weeks. Symptoms have been intermittent, cramping, and without modifying factors. Patient denies taking any medications for her symptoms. She has not had any known fevers. No vomiting, melena, hematochezia, vaginal bleeding, or vaginal discharge. She does report some mild nausea. She states that her last menstrual period was 10/28/2016. She states that she is sexually active without the use of protection, but has taken multiple home pregnancy tests which were negative. Abdominal surgical history significant for left salpingectomy.  OBGYN - Dr. Cherly Hensen     Past Medical History:  Diagnosis Date  . Ectopic pregnancy march 2013    Patient Active Problem List   Diagnosis Date Noted  . Post-dates pregnancy 01/28/2013  . Hx of ectopic pregnancy 06/16/2012    Past Surgical History:  Procedure Laterality Date  . DILATION AND CURETTAGE OF UTERUS     05/2011  . PELVIC LAPAROSCOPY  march 2013   left salpingectomy    OB History    Gravida Para Term Preterm AB Living   5 1 1   4 1    SAB TAB Ectopic Multiple Live Births   1   1   1        Home Medications    Prior to Admission medications   Medication Sig Start Date End Date Taking? Authorizing Provider  acetaminophen (TYLENOL) 500 MG tablet Take 1 tablet (500 mg total) by mouth every 6 (six) hours as needed. 12/12/16   Antony Madura, PA-C  cyclobenzaprine (FLEXERIL) 5 MG tablet Take 1 tablet (5 mg total) by mouth 3 (three) times daily as needed for muscle spasms. 10/18/14   Earley Favor, NP  loratadine-pseudoephedrine (CLARITIN-D 12 HOUR) 5-120 MG per tablet Take 1 tablet by mouth 2 (two) times daily. 10/18/14   Earley Favor, NP    Prenatal Vit-Fe Fumarate-FA (PRENATAL MULTIVITAMIN) TABS Take 1 tablet by mouth daily at 12 noon.    Historical Provider, MD    Family History Family History  Problem Relation Age of Onset  . Hypertension Mother   . Cancer Paternal Grandfather 32    COLON  . Diabetes Maternal Aunt   . Diabetes Paternal Aunt     Social History Social History  Substance Use Topics  . Smoking status: Never Smoker  . Smokeless tobacco: Never Used  . Alcohol use Yes     Comment: SOCIALLY     Allergies   Patient has no known allergies.   Review of Systems Review of Systems Ten systems reviewed and are negative for acute change, except as noted in the HPI.    Physical Exam Updated Vital Signs BP 105/62   Pulse 71   Temp 98.5 F (36.9 C) (Oral)   Resp 16   Ht 5\' 7"  (1.702 m)   Wt 108.9 kg   LMP 10/28/2016 (Exact Date)   SpO2 98%   BMI 37.59 kg/m   Physical Exam  Constitutional: She is oriented to person, place, and time. She appears well-developed and well-nourished. No distress.  Nontoxic and in NAD  HENT:  Head: Normocephalic and atraumatic.  Eyes: Conjunctivae and EOM are normal. No scleral icterus.  Neck: Normal range of motion.  Cardiovascular: Normal  rate, regular rhythm and intact distal pulses.   Pulmonary/Chest: Effort normal. No respiratory distress. She has no wheezes.  Respirations even and unlabored. Lungs CTAB.  Abdominal: Soft. She exhibits no distension and no mass. There is no tenderness. There is no guarding.  Soft, obese, nontender abdomen.  Genitourinary: There is no rash, tenderness or lesion on the right labia. There is no rash, tenderness or lesion on the left labia. Uterus is not tender. Cervix exhibits friability. Cervix exhibits no motion tenderness. Right adnexum displays no mass and no tenderness. Left adnexum displays no mass and no tenderness. Vaginal discharge (yellow, thick) found.  Musculoskeletal: Normal range of motion.  Neurological: She is  alert and oriented to person, place, and time. She exhibits normal muscle tone. Coordination normal.  Skin: Skin is warm and dry. No rash noted. She is not diaphoretic. No erythema. No pallor.  Psychiatric: She has a normal mood and affect. Her behavior is normal.  Nursing note and vitals reviewed.    ED Treatments / Results  Labs (all labs ordered are listed, but only abnormal results are displayed) Labs Reviewed  WET PREP, GENITAL - Abnormal; Notable for the following:       Result Value   Clue Cells Wet Prep HPF POC PRESENT (*)    WBC, Wet Prep HPF POC MANY (*)    All other components within normal limits  COMPREHENSIVE METABOLIC PANEL - Abnormal; Notable for the following:    Potassium 3.4 (*)    Chloride 100 (*)    All other components within normal limits  URINALYSIS, ROUTINE W REFLEX MICROSCOPIC - Abnormal; Notable for the following:    Hgb urine dipstick MODERATE (*)    Leukocytes, UA TRACE (*)    Squamous Epithelial / LPF 0-5 (*)    All other components within normal limits  HCG, QUANTITATIVE, PREGNANCY - Abnormal; Notable for the following:    hCG, Beta Chain, Quant, S 147,536 (*)    All other components within normal limits  LIPASE, BLOOD  CBC  TYPE AND SCREEN  ABO/RH  GC/CHLAMYDIA PROBE AMP ([place]) NOT AT [facility]    EKG  EKG Interpretation None       Radiology US Ob Comp Less 14 Wks  Result Date: 12/12/2016 CLINICAL DATA:  RIGHT lower quadrant pain for 6 weeks. Gestational age by last menstrual. Six weeks and 3 days. EXAM: OBSTETRIC <14 WK [place] AND TRANSVAGINAL OB US DOPPLER ULTRASOUND OF OVARIES TECHNIQUE: Both transabdominal and transvaginal ultrasound examinations were performed for complete evaluation of the gestation as well as the maternal uterus, adnexal regions, and pelvic cul-de-sac. Transvaginal technique was performed to assess early pregnancy. Color and duplex Doppler ultrasound was utilized to evaluate blood flow to the ovaries. COMPARISON:  None.  FINDINGS: Intrauterine gestational sac: Present Yolk sac:  Present Embryo:  Present Cardiac Activity: Present Heart Rate: 165 bpm CRL:   18  mm 8 w 2 d                  [place] EDC: July 22, 2017 Subchorionic hemorrhage:  None visualized. Maternal uterus/adnexae: RIGHT ovary is 3.4 x 1.9 x 2.5 cm, LEFT is 2.3 x 1.1 x 1.5 cm, normal appearance of the adnexae bilaterally. Pulsed Doppler evaluation of both ovaries demonstrates normal appearing low-resistance arterial and venous waveforms. IMPRESSION: Single live intrauterine pregnancy, gestational age by ultrasound 8 weeks and 2 days, no complication. Electronically Signed   By: Awilda Metro M.D.   On: 12/12/2016 03:37  [place] Ob Transvaginal  Result Date: 12/12/2016 CLINICAL DATA:  RIGHT lower quadrant pain for 6 weeks. Gestational age by last menstrual. Six weeks and 3 days. EXAM: OBSTETRIC <14 WK US AND TRANSVAGINAL OB US DOPPLER ULTRASOUND OF OVARIES TECHNIQUE: Both transabdominal and transvaginal ultrasound examinations were performed for complete evaluation of the gestation as well as the maternal uterus, adnexal regions, and pelvic cul-de-sac. Transvaginal technique was performed to assess early pregnancy. Color and duplex Doppler ultrasound was utilized to evaluate blood flow to the ovaries. COMPARISON:  None. FINDINGS: Intrauterine gestational sac: Present Yolk sac:  Present Embryo:  Present Cardiac Activity: Present Heart Rate: 165 bpm CRL:   18  mm 8 w 2 d                  US EDC: July 22, 2017 Subchorionic hemorrhage:  None visualized. Maternal uterus/adnexae: RIGHT ovary is 3.4 x 1.9 x 2.5 cm, LEFT is 2.3 x 1.1 x 1.5 cm, normal appearance of the adnexae bilaterally. Pulsed Doppler evaluation of both ovaries demonstrates normal appearing low-resistance arterial and venous waveforms. IMPRESSION: Single live intrauterine pregnancy, gestational age by ultrasound 8 weeks and 2 days, no complication. Electronically Signed   By: Awilda Metroourtnay  Bloomer M.D.   On:  12/12/2016 03:37   [place] Art/ven Flow Abd Pelv Doppler  Result Date: 12/12/2016 CLINICAL DATA:  RIGHT lower quadrant pain for 6 weeks. Gestational age by last menstrual. Six weeks and 3 days. EXAM: OBSTETRIC <14 WK US AND TRANSVAGINAL OB US DOPPLER ULTRASOUND OF OVARIES TECHNIQUE: Both transabdominal and transvaginal ultrasound examinations were performed for complete evaluation of the gestation as well as the maternal uterus, adnexal regions, and pelvic cul-de-sac. Transvaginal technique was performed to assess early pregnancy. Color and duplex Doppler ultrasound was utilized to evaluate blood flow to the ovaries. COMPARISON:  None. FINDINGS: Intrauterine gestational sac: Present Yolk sac:  Present Embryo:  Present Cardiac Activity: Present Heart Rate: 165 bpm CRL:   18  mm 8 w 2 d                  US EDC: July 22, 2017 Subchorionic hemorrhage:  None visualized. Maternal uterus/adnexae: RIGHT ovary is 3.4 x 1.9 x 2.5 cm, LEFT is 2.3 x 1.1 x 1.5 cm, normal appearance of the adnexae bilaterally. Pulsed Doppler evaluation of both ovaries demonstrates normal appearing low-resistance arterial and venous waveforms. IMPRESSION: Single live intrauterine pregnancy, gestational age by ultrasound 8 weeks and 2 days, no complication. Electronically Signed   By: Awilda Metroourtnay  Bloomer M.D.   On: 12/12/2016 03:37    Procedures Procedures (including critical care time)  Medications Ordered in ED Medications - No data to display   Initial Impression / Assessment and Plan / ED Course  I have reviewed the triage vital signs and the nursing notes.  Pertinent labs & imaging results that were available during my care of the patient were reviewed by me and considered in my medical decision making (see chart for details).     31 year old female presents to the emergency department for evaluation of 6 weeks of intermittent lower abdominal cramping without modifying factors. She reports associated nausea. Patient states that  she took multiple home pregnancy tests which were negative. She was found to be pregnant when in the emergency department today. Patient with reassuring physical exam. Laboratory workup is noncontributory. Ultrasound reveals single live intrauterine pregnancy of approximately 8 weeks 2 days gestation. No complicating features of pregnancy identified.  Abdominal cramping likely secondary to stretching of  round ligament. We will manage with Tylenol. Patient advised to schedule follow-up with her OB/GYN for prenatal care. Return precautions discussed and provided. Patient discharged in stable condition with no unaddressed concerns.   Final Clinical Impressions(s) / ED Diagnoses   Final diagnoses:  Abdominal pain during pregnancy in first trimester    New Prescriptions New Prescriptions   ACETAMINOPHEN (TYLENOL) 500 MG TABLET    Take 1 tablet (500 mg total) by mouth every 6 (six) hours as needed.     Antony Madura, PA-C 12/12/16 1610    Layla Maw Ward, DO 12/12/16 [phone]

## 2016-12-12 NOTE — Discharge Instructions (Signed)
Take a prenatal vitamin daily. We advise the use of Tylenol for pain as needed. Follow-up with your OB/GYN for prenatal care. [facility] for new or concerning symptoms associated with pregnancy.

## 2016-12-30 DIAGNOSIS — R8290 Unspecified abnormal findings in urine: Secondary | ICD-10-CM | POA: Diagnosis not present

## 2016-12-30 DIAGNOSIS — N760 Acute vaginitis: Secondary | ICD-10-CM | POA: Diagnosis not present

## 2016-12-30 DIAGNOSIS — Z1329 Encounter for screening for other suspected endocrine disorder: Secondary | ICD-10-CM | POA: Diagnosis not present

## 2016-12-30 DIAGNOSIS — Z131 Encounter for screening for diabetes mellitus: Secondary | ICD-10-CM | POA: Diagnosis not present

## 2016-12-30 DIAGNOSIS — R87615 Unsatisfactory cytologic smear of cervix: Secondary | ICD-10-CM | POA: Diagnosis not present

## 2016-12-30 DIAGNOSIS — E669 Obesity, unspecified: Secondary | ICD-10-CM | POA: Diagnosis not present

## 2016-12-30 DIAGNOSIS — Z124 Encounter for screening for malignant neoplasm of cervix: Secondary | ICD-10-CM | POA: Diagnosis not present

## 2016-12-30 DIAGNOSIS — R8299 Other abnormal findings in urine: Secondary | ICD-10-CM | POA: Diagnosis not present

## 2017-06-18 DIAGNOSIS — Z118 Encounter for screening for other infectious and parasitic diseases: Secondary | ICD-10-CM | POA: Diagnosis not present

## 2017-06-18 DIAGNOSIS — Z114 Encounter for screening for human immunodeficiency virus [HIV]: Secondary | ICD-10-CM | POA: Diagnosis not present

## 2017-06-18 DIAGNOSIS — Z1159 Encounter for screening for other viral diseases: Secondary | ICD-10-CM | POA: Diagnosis not present

## 2017-06-18 DIAGNOSIS — B373 Candidiasis of vulva and vagina: Secondary | ICD-10-CM | POA: Diagnosis not present

## 2017-06-18 DIAGNOSIS — Z113 Encounter for screening for infections with a predominantly sexual mode of transmission: Secondary | ICD-10-CM | POA: Diagnosis not present

## 2017-06-18 DIAGNOSIS — N760 Acute vaginitis: Secondary | ICD-10-CM | POA: Diagnosis not present

## 2017-11-19 DIAGNOSIS — Z01419 Encounter for gynecological examination (general) (routine) without abnormal findings: Secondary | ICD-10-CM | POA: Diagnosis not present

## 2017-11-19 DIAGNOSIS — Z1159 Encounter for screening for other viral diseases: Secondary | ICD-10-CM | POA: Diagnosis not present

## 2017-11-19 DIAGNOSIS — Z113 Encounter for screening for infections with a predominantly sexual mode of transmission: Secondary | ICD-10-CM | POA: Diagnosis not present

## 2017-11-19 DIAGNOSIS — Z118 Encounter for screening for other infectious and parasitic diseases: Secondary | ICD-10-CM | POA: Diagnosis not present

## 2017-11-19 DIAGNOSIS — Z114 Encounter for screening for human immunodeficiency virus [HIV]: Secondary | ICD-10-CM | POA: Diagnosis not present

## 2018-08-02 DIAGNOSIS — Z3200 Encounter for pregnancy test, result unknown: Secondary | ICD-10-CM | POA: Diagnosis not present

## 2018-08-02 DIAGNOSIS — Z118 Encounter for screening for other infectious and parasitic diseases: Secondary | ICD-10-CM | POA: Diagnosis not present

## 2018-08-02 DIAGNOSIS — Z114 Encounter for screening for human immunodeficiency virus [HIV]: Secondary | ICD-10-CM | POA: Diagnosis not present

## 2018-08-02 DIAGNOSIS — N760 Acute vaginitis: Secondary | ICD-10-CM | POA: Diagnosis not present

## 2018-08-02 DIAGNOSIS — Z113 Encounter for screening for infections with a predominantly sexual mode of transmission: Secondary | ICD-10-CM | POA: Diagnosis not present

## 2018-08-02 DIAGNOSIS — Z1159 Encounter for screening for other viral diseases: Secondary | ICD-10-CM | POA: Diagnosis not present

## 2019-07-13 DIAGNOSIS — Z01419 Encounter for gynecological examination (general) (routine) without abnormal findings: Secondary | ICD-10-CM | POA: Diagnosis not present

## 2019-07-13 DIAGNOSIS — Z0000 Encounter for general adult medical examination without abnormal findings: Secondary | ICD-10-CM | POA: Diagnosis not present

## 2019-07-13 DIAGNOSIS — Z130 Encounter for screening for diseases of the blood and blood-forming organs and certain disorders involving the immune mechanism: Secondary | ICD-10-CM | POA: Diagnosis not present

## 2019-07-13 DIAGNOSIS — Z1159 Encounter for screening for other viral diseases: Secondary | ICD-10-CM | POA: Diagnosis not present

## 2019-07-13 DIAGNOSIS — Z01818 Encounter for other preprocedural examination: Secondary | ICD-10-CM | POA: Diagnosis not present

## 2019-07-13 DIAGNOSIS — Z1329 Encounter for screening for other suspected endocrine disorder: Secondary | ICD-10-CM | POA: Diagnosis not present

## 2019-07-13 DIAGNOSIS — Z113 Encounter for screening for infections with a predominantly sexual mode of transmission: Secondary | ICD-10-CM | POA: Diagnosis not present

## 2019-07-13 DIAGNOSIS — Z118 Encounter for screening for other infectious and parasitic diseases: Secondary | ICD-10-CM | POA: Diagnosis not present

## 2019-07-13 DIAGNOSIS — Z131 Encounter for screening for diabetes mellitus: Secondary | ICD-10-CM | POA: Diagnosis not present

## 2019-07-13 DIAGNOSIS — Z13220 Encounter for screening for lipoid disorders: Secondary | ICD-10-CM | POA: Diagnosis not present

## 2019-07-13 DIAGNOSIS — Z1231 Encounter for screening mammogram for malignant neoplasm of breast: Secondary | ICD-10-CM | POA: Diagnosis not present

## 2019-07-13 DIAGNOSIS — Z6841 Body Mass Index (BMI) 40.0 and over, adult: Secondary | ICD-10-CM | POA: Diagnosis not present

## 2019-08-28 DIAGNOSIS — Z20828 Contact with and (suspected) exposure to other viral communicable diseases: Secondary | ICD-10-CM | POA: Diagnosis not present

## 2019-12-14 DIAGNOSIS — Z118 Encounter for screening for other infectious and parasitic diseases: Secondary | ICD-10-CM | POA: Diagnosis not present

## 2019-12-14 DIAGNOSIS — R350 Frequency of micturition: Secondary | ICD-10-CM | POA: Diagnosis not present

## 2019-12-14 DIAGNOSIS — Z1159 Encounter for screening for other viral diseases: Secondary | ICD-10-CM | POA: Diagnosis not present

## 2019-12-14 DIAGNOSIS — Z113 Encounter for screening for infections with a predominantly sexual mode of transmission: Secondary | ICD-10-CM | POA: Diagnosis not present

## 2019-12-14 DIAGNOSIS — N760 Acute vaginitis: Secondary | ICD-10-CM | POA: Diagnosis not present

## 2019-12-14 DIAGNOSIS — E559 Vitamin D deficiency, unspecified: Secondary | ICD-10-CM | POA: Diagnosis not present

## 2019-12-14 DIAGNOSIS — Z114 Encounter for screening for human immunodeficiency virus [HIV]: Secondary | ICD-10-CM | POA: Diagnosis not present

## 2020-05-23 DIAGNOSIS — Z20822 Contact with and (suspected) exposure to covid-19: Secondary | ICD-10-CM | POA: Diagnosis not present

## 2020-05-23 DIAGNOSIS — R05 Cough: Secondary | ICD-10-CM | POA: Diagnosis not present

## 2020-05-28 DIAGNOSIS — U071 COVID-19: Secondary | ICD-10-CM | POA: Diagnosis not present

## 2020-07-06 DIAGNOSIS — N926 Irregular menstruation, unspecified: Secondary | ICD-10-CM | POA: Diagnosis not present

## 2020-07-06 DIAGNOSIS — Z113 Encounter for screening for infections with a predominantly sexual mode of transmission: Secondary | ICD-10-CM | POA: Diagnosis not present

## 2020-07-06 DIAGNOSIS — Z114 Encounter for screening for human immunodeficiency virus [HIV]: Secondary | ICD-10-CM | POA: Diagnosis not present

## 2020-07-06 DIAGNOSIS — Z1159 Encounter for screening for other viral diseases: Secondary | ICD-10-CM | POA: Diagnosis not present

## 2020-12-11 DIAGNOSIS — Z419 Encounter for procedure for purposes other than remedying health state, unspecified: Secondary | ICD-10-CM | POA: Diagnosis not present

## 2021-01-11 DIAGNOSIS — Z419 Encounter for procedure for purposes other than remedying health state, unspecified: Secondary | ICD-10-CM | POA: Diagnosis not present

## 2021-02-10 DIAGNOSIS — Z419 Encounter for procedure for purposes other than remedying health state, unspecified: Secondary | ICD-10-CM | POA: Diagnosis not present

## 2021-03-10 DIAGNOSIS — B373 Candidiasis of vulva and vagina: Secondary | ICD-10-CM | POA: Diagnosis not present

## 2021-03-13 DIAGNOSIS — Z419 Encounter for procedure for purposes other than remedying health state, unspecified: Secondary | ICD-10-CM | POA: Diagnosis not present

## 2021-04-12 DIAGNOSIS — Z419 Encounter for procedure for purposes other than remedying health state, unspecified: Secondary | ICD-10-CM | POA: Diagnosis not present

## 2021-05-10 DIAGNOSIS — Z20828 Contact with and (suspected) exposure to other viral communicable diseases: Secondary | ICD-10-CM | POA: Diagnosis not present

## 2021-05-10 DIAGNOSIS — J069 Acute upper respiratory infection, unspecified: Secondary | ICD-10-CM | POA: Diagnosis not present

## 2021-05-13 DIAGNOSIS — Z419 Encounter for procedure for purposes other than remedying health state, unspecified: Secondary | ICD-10-CM | POA: Diagnosis not present

## 2021-06-13 DIAGNOSIS — Z419 Encounter for procedure for purposes other than remedying health state, unspecified: Secondary | ICD-10-CM | POA: Diagnosis not present

## 2021-07-13 DIAGNOSIS — Z419 Encounter for procedure for purposes other than remedying health state, unspecified: Secondary | ICD-10-CM | POA: Diagnosis not present

## 2021-08-13 DIAGNOSIS — Z419 Encounter for procedure for purposes other than remedying health state, unspecified: Secondary | ICD-10-CM | POA: Diagnosis not present

## 2021-09-12 DIAGNOSIS — Z419 Encounter for procedure for purposes other than remedying health state, unspecified: Secondary | ICD-10-CM | POA: Diagnosis not present

## 2021-10-13 DIAGNOSIS — Z419 Encounter for procedure for purposes other than remedying health state, unspecified: Secondary | ICD-10-CM | POA: Diagnosis not present

## 2021-11-13 DIAGNOSIS — Z419 Encounter for procedure for purposes other than remedying health state, unspecified: Secondary | ICD-10-CM | POA: Diagnosis not present

## 2021-12-11 DIAGNOSIS — Z419 Encounter for procedure for purposes other than remedying health state, unspecified: Secondary | ICD-10-CM | POA: Diagnosis not present

## 2022-01-11 DIAGNOSIS — Z419 Encounter for procedure for purposes other than remedying health state, unspecified: Secondary | ICD-10-CM | POA: Diagnosis not present

## 2022-02-10 DIAGNOSIS — Z419 Encounter for procedure for purposes other than remedying health state, unspecified: Secondary | ICD-10-CM | POA: Diagnosis not present

## 2022-03-13 DIAGNOSIS — Z419 Encounter for procedure for purposes other than remedying health state, unspecified: Secondary | ICD-10-CM | POA: Diagnosis not present

## 2022-04-12 DIAGNOSIS — Z419 Encounter for procedure for purposes other than remedying health state, unspecified: Secondary | ICD-10-CM | POA: Diagnosis not present

## 2022-04-23 DIAGNOSIS — Z202 Contact with and (suspected) exposure to infections with a predominantly sexual mode of transmission: Secondary | ICD-10-CM | POA: Diagnosis not present

## 2022-04-23 DIAGNOSIS — N926 Irregular menstruation, unspecified: Secondary | ICD-10-CM | POA: Diagnosis not present

## 2022-05-13 DIAGNOSIS — Z419 Encounter for procedure for purposes other than remedying health state, unspecified: Secondary | ICD-10-CM | POA: Diagnosis not present

## 2022-06-13 DIAGNOSIS — Z419 Encounter for procedure for purposes other than remedying health state, unspecified: Secondary | ICD-10-CM | POA: Diagnosis not present

## 2022-07-13 DIAGNOSIS — Z419 Encounter for procedure for purposes other than remedying health state, unspecified: Secondary | ICD-10-CM | POA: Diagnosis not present

## 2022-08-13 DIAGNOSIS — Z419 Encounter for procedure for purposes other than remedying health state, unspecified: Secondary | ICD-10-CM | POA: Diagnosis not present

## 2022-09-12 DIAGNOSIS — Z419 Encounter for procedure for purposes other than remedying health state, unspecified: Secondary | ICD-10-CM | POA: Diagnosis not present

## 2022-10-13 DIAGNOSIS — Z419 Encounter for procedure for purposes other than remedying health state, unspecified: Secondary | ICD-10-CM | POA: Diagnosis not present

## 2022-11-13 DIAGNOSIS — Z419 Encounter for procedure for purposes other than remedying health state, unspecified: Secondary | ICD-10-CM | POA: Diagnosis not present

## 2022-12-12 DIAGNOSIS — Z419 Encounter for procedure for purposes other than remedying health state, unspecified: Secondary | ICD-10-CM | POA: Diagnosis not present

## 2022-12-22 DIAGNOSIS — L700 Acne vulgaris: Secondary | ICD-10-CM | POA: Diagnosis not present

## 2022-12-22 DIAGNOSIS — L810 Postinflammatory hyperpigmentation: Secondary | ICD-10-CM | POA: Diagnosis not present

## 2022-12-22 DIAGNOSIS — Z5181 Encounter for therapeutic drug level monitoring: Secondary | ICD-10-CM | POA: Diagnosis not present

## 2023-01-12 DIAGNOSIS — Z419 Encounter for procedure for purposes other than remedying health state, unspecified: Secondary | ICD-10-CM | POA: Diagnosis not present

## 2023-02-11 DIAGNOSIS — Z419 Encounter for procedure for purposes other than remedying health state, unspecified: Secondary | ICD-10-CM | POA: Diagnosis not present

## 2023-02-23 DIAGNOSIS — Z5181 Encounter for therapeutic drug level monitoring: Secondary | ICD-10-CM | POA: Diagnosis not present

## 2023-02-23 DIAGNOSIS — L810 Postinflammatory hyperpigmentation: Secondary | ICD-10-CM | POA: Diagnosis not present

## 2023-02-23 DIAGNOSIS — L700 Acne vulgaris: Secondary | ICD-10-CM | POA: Diagnosis not present

## 2023-03-14 DIAGNOSIS — Z419 Encounter for procedure for purposes other than remedying health state, unspecified: Secondary | ICD-10-CM | POA: Diagnosis not present

## 2023-03-26 DIAGNOSIS — Z5181 Encounter for therapeutic drug level monitoring: Secondary | ICD-10-CM | POA: Diagnosis not present

## 2023-03-26 DIAGNOSIS — L700 Acne vulgaris: Secondary | ICD-10-CM | POA: Diagnosis not present

## 2023-04-13 DIAGNOSIS — Z419 Encounter for procedure for purposes other than remedying health state, unspecified: Secondary | ICD-10-CM | POA: Diagnosis not present

## 2023-05-14 DIAGNOSIS — Z419 Encounter for procedure for purposes other than remedying health state, unspecified: Secondary | ICD-10-CM | POA: Diagnosis not present

## 2023-06-14 DIAGNOSIS — Z419 Encounter for procedure for purposes other than remedying health state, unspecified: Secondary | ICD-10-CM | POA: Diagnosis not present

## 2023-07-14 DIAGNOSIS — Z419 Encounter for procedure for purposes other than remedying health state, unspecified: Secondary | ICD-10-CM | POA: Diagnosis not present

## 2023-08-14 DIAGNOSIS — Z419 Encounter for procedure for purposes other than remedying health state, unspecified: Secondary | ICD-10-CM | POA: Diagnosis not present

## 2023-09-13 DIAGNOSIS — Z419 Encounter for procedure for purposes other than remedying health state, unspecified: Secondary | ICD-10-CM | POA: Diagnosis not present

## 2023-10-14 DIAGNOSIS — Z419 Encounter for procedure for purposes other than remedying health state, unspecified: Secondary | ICD-10-CM | POA: Diagnosis not present

## 2023-11-14 DIAGNOSIS — Z419 Encounter for procedure for purposes other than remedying health state, unspecified: Secondary | ICD-10-CM | POA: Diagnosis not present

## 2023-12-01 DIAGNOSIS — F330 Major depressive disorder, recurrent, mild: Secondary | ICD-10-CM | POA: Diagnosis not present

## 2023-12-02 DIAGNOSIS — F411 Generalized anxiety disorder: Secondary | ICD-10-CM | POA: Diagnosis not present

## 2023-12-06 DIAGNOSIS — F411 Generalized anxiety disorder: Secondary | ICD-10-CM | POA: Diagnosis not present

## 2023-12-10 DIAGNOSIS — F411 Generalized anxiety disorder: Secondary | ICD-10-CM | POA: Diagnosis not present

## 2023-12-12 DIAGNOSIS — Z419 Encounter for procedure for purposes other than remedying health state, unspecified: Secondary | ICD-10-CM | POA: Diagnosis not present

## 2023-12-13 DIAGNOSIS — F411 Generalized anxiety disorder: Secondary | ICD-10-CM | POA: Diagnosis not present

## 2023-12-14 DIAGNOSIS — F332 Major depressive disorder, recurrent severe without psychotic features: Secondary | ICD-10-CM | POA: Diagnosis not present

## 2023-12-15 DIAGNOSIS — F332 Major depressive disorder, recurrent severe without psychotic features: Secondary | ICD-10-CM | POA: Diagnosis not present

## 2023-12-16 DIAGNOSIS — F411 Generalized anxiety disorder: Secondary | ICD-10-CM | POA: Diagnosis not present

## 2023-12-18 DIAGNOSIS — F411 Generalized anxiety disorder: Secondary | ICD-10-CM | POA: Diagnosis not present

## 2023-12-20 DIAGNOSIS — F411 Generalized anxiety disorder: Secondary | ICD-10-CM | POA: Diagnosis not present

## 2023-12-22 DIAGNOSIS — F411 Generalized anxiety disorder: Secondary | ICD-10-CM | POA: Diagnosis not present

## 2023-12-27 DIAGNOSIS — F332 Major depressive disorder, recurrent severe without psychotic features: Secondary | ICD-10-CM | POA: Diagnosis not present

## 2023-12-31 DIAGNOSIS — F332 Major depressive disorder, recurrent severe without psychotic features: Secondary | ICD-10-CM | POA: Diagnosis not present

## 2024-01-03 DIAGNOSIS — F331 Major depressive disorder, recurrent, moderate: Secondary | ICD-10-CM | POA: Diagnosis not present

## 2024-01-08 DIAGNOSIS — F411 Generalized anxiety disorder: Secondary | ICD-10-CM | POA: Diagnosis not present

## 2024-01-10 DIAGNOSIS — F411 Generalized anxiety disorder: Secondary | ICD-10-CM | POA: Diagnosis not present

## 2024-01-15 DIAGNOSIS — F411 Generalized anxiety disorder: Secondary | ICD-10-CM | POA: Diagnosis not present

## 2024-01-20 DIAGNOSIS — F411 Generalized anxiety disorder: Secondary | ICD-10-CM | POA: Diagnosis not present

## 2024-01-22 DIAGNOSIS — F411 Generalized anxiety disorder: Secondary | ICD-10-CM | POA: Diagnosis not present

## 2024-01-23 DIAGNOSIS — Z419 Encounter for procedure for purposes other than remedying health state, unspecified: Secondary | ICD-10-CM | POA: Diagnosis not present

## 2024-01-24 DIAGNOSIS — F411 Generalized anxiety disorder: Secondary | ICD-10-CM | POA: Diagnosis not present

## 2024-01-28 DIAGNOSIS — F411 Generalized anxiety disorder: Secondary | ICD-10-CM | POA: Diagnosis not present

## 2024-01-31 DIAGNOSIS — F331 Major depressive disorder, recurrent, moderate: Secondary | ICD-10-CM | POA: Diagnosis not present

## 2024-02-05 DIAGNOSIS — F331 Major depressive disorder, recurrent, moderate: Secondary | ICD-10-CM | POA: Diagnosis not present

## 2024-02-07 DIAGNOSIS — F331 Major depressive disorder, recurrent, moderate: Secondary | ICD-10-CM | POA: Diagnosis not present

## 2024-02-09 DIAGNOSIS — F331 Major depressive disorder, recurrent, moderate: Secondary | ICD-10-CM | POA: Diagnosis not present

## 2024-02-16 DIAGNOSIS — F331 Major depressive disorder, recurrent, moderate: Secondary | ICD-10-CM | POA: Diagnosis not present

## 2024-02-18 DIAGNOSIS — F331 Major depressive disorder, recurrent, moderate: Secondary | ICD-10-CM | POA: Diagnosis not present

## 2024-02-22 DIAGNOSIS — Z419 Encounter for procedure for purposes other than remedying health state, unspecified: Secondary | ICD-10-CM | POA: Diagnosis not present

## 2024-02-23 DIAGNOSIS — F411 Generalized anxiety disorder: Secondary | ICD-10-CM | POA: Diagnosis not present

## 2024-02-26 DIAGNOSIS — F411 Generalized anxiety disorder: Secondary | ICD-10-CM | POA: Diagnosis not present

## 2024-02-28 DIAGNOSIS — F411 Generalized anxiety disorder: Secondary | ICD-10-CM | POA: Diagnosis not present

## 2024-03-02 DIAGNOSIS — F411 Generalized anxiety disorder: Secondary | ICD-10-CM | POA: Diagnosis not present

## 2024-03-06 DIAGNOSIS — F332 Major depressive disorder, recurrent severe without psychotic features: Secondary | ICD-10-CM | POA: Diagnosis not present

## 2024-03-08 DIAGNOSIS — F332 Major depressive disorder, recurrent severe without psychotic features: Secondary | ICD-10-CM | POA: Diagnosis not present

## 2024-03-09 DIAGNOSIS — F411 Generalized anxiety disorder: Secondary | ICD-10-CM | POA: Diagnosis not present

## 2024-03-14 DIAGNOSIS — F411 Generalized anxiety disorder: Secondary | ICD-10-CM | POA: Diagnosis not present

## 2024-03-19 DIAGNOSIS — F411 Generalized anxiety disorder: Secondary | ICD-10-CM | POA: Diagnosis not present

## 2024-03-20 DIAGNOSIS — F411 Generalized anxiety disorder: Secondary | ICD-10-CM | POA: Diagnosis not present

## 2024-03-24 DIAGNOSIS — F411 Generalized anxiety disorder: Secondary | ICD-10-CM | POA: Diagnosis not present

## 2024-03-24 DIAGNOSIS — Z419 Encounter for procedure for purposes other than remedying health state, unspecified: Secondary | ICD-10-CM | POA: Diagnosis not present

## 2024-03-27 DIAGNOSIS — F411 Generalized anxiety disorder: Secondary | ICD-10-CM | POA: Diagnosis not present

## 2024-03-28 DIAGNOSIS — F411 Generalized anxiety disorder: Secondary | ICD-10-CM | POA: Diagnosis not present

## 2024-04-01 DIAGNOSIS — F411 Generalized anxiety disorder: Secondary | ICD-10-CM | POA: Diagnosis not present

## 2024-04-03 DIAGNOSIS — F411 Generalized anxiety disorder: Secondary | ICD-10-CM | POA: Diagnosis not present

## 2024-04-06 DIAGNOSIS — F411 Generalized anxiety disorder: Secondary | ICD-10-CM | POA: Diagnosis not present

## 2024-04-10 DIAGNOSIS — F411 Generalized anxiety disorder: Secondary | ICD-10-CM | POA: Diagnosis not present

## 2024-04-13 DIAGNOSIS — F411 Generalized anxiety disorder: Secondary | ICD-10-CM | POA: Diagnosis not present

## 2024-04-17 DIAGNOSIS — F411 Generalized anxiety disorder: Secondary | ICD-10-CM | POA: Diagnosis not present

## 2024-04-20 DIAGNOSIS — F411 Generalized anxiety disorder: Secondary | ICD-10-CM | POA: Diagnosis not present

## 2024-04-24 DIAGNOSIS — F411 Generalized anxiety disorder: Secondary | ICD-10-CM | POA: Diagnosis not present

## 2024-04-29 DIAGNOSIS — F411 Generalized anxiety disorder: Secondary | ICD-10-CM | POA: Diagnosis not present

## 2024-05-02 DIAGNOSIS — F411 Generalized anxiety disorder: Secondary | ICD-10-CM | POA: Diagnosis not present
